# Patient Record
Sex: Male | Born: 1995 | Race: White | Hispanic: No | Marital: Single | Smoking: Current every day smoker
Health system: Southern US, Community
[De-identification: ages and names within clinical notes are randomized; demographics above are authoritative.]

## PROBLEM LIST (undated history)

## (undated) DIAGNOSIS — J45909 Unspecified asthma, uncomplicated: Secondary | ICD-10-CM

## (undated) DIAGNOSIS — F1120 Opioid dependence, uncomplicated: Secondary | ICD-10-CM

---
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
## 2016-05-23 ENCOUNTER — Encounter (HOSPITAL_COMMUNITY): Payer: Self-pay | Admitting: Emergency Medicine

## 2016-05-23 ENCOUNTER — Emergency Department (HOSPITAL_COMMUNITY)
Admission: EM | Admit: 2016-05-23 | Discharge: 2016-05-24 | Disposition: A | Discharge status: Other Acute Inpatient Hospital | Payer: BC Managed Care – PPO | Attending: Emergency Medicine | Admitting: Emergency Medicine

## 2016-05-23 DIAGNOSIS — F1920 Other psychoactive substance dependence, uncomplicated: Secondary | ICD-10-CM | POA: Diagnosis not present

## 2016-05-23 DIAGNOSIS — F332 Major depressive disorder, recurrent severe without psychotic features: Secondary | ICD-10-CM | POA: Diagnosis present

## 2016-05-23 DIAGNOSIS — Z5181 Encounter for therapeutic drug level monitoring: Secondary | ICD-10-CM | POA: Diagnosis not present

## 2016-05-23 DIAGNOSIS — R45851 Suicidal ideations: Secondary | ICD-10-CM | POA: Diagnosis present

## 2016-05-23 DIAGNOSIS — F1120 Opioid dependence, uncomplicated: Secondary | ICD-10-CM | POA: Diagnosis present

## 2016-05-23 DIAGNOSIS — J45909 Unspecified asthma, uncomplicated: Secondary | ICD-10-CM | POA: Insufficient documentation

## 2016-05-23 DIAGNOSIS — F17210 Nicotine dependence, cigarettes, uncomplicated: Secondary | ICD-10-CM | POA: Diagnosis not present

## 2016-05-23 DIAGNOSIS — F1110 Opioid abuse, uncomplicated: Secondary | ICD-10-CM | POA: Diagnosis not present

## 2016-05-23 HISTORY — DX: Unspecified asthma, uncomplicated: J45.909

## 2016-05-23 HISTORY — DX: Opioid dependence, uncomplicated: F11.20

## 2016-05-23 MED ORDER — ONDANSETRON HCL 4 MG PO TABS: 4.0000 mg | ORAL_TABLET | Freq: Three times a day (TID) | ORAL | Status: DC | PRN

## 2016-05-23 MED ORDER — NICOTINE 21 MG/24HR TD PT24
21.0000 mg | MEDICATED_PATCH | Freq: Every day | TRANSDERMAL | Status: DC
  Administered 2016-05-24 (×2): 21 mg via TRANSDERMAL
  Filled 2016-05-23 (×2): qty 1

## 2016-05-23 MED ORDER — ALUM & MAG HYDROXIDE-SIMETH 200-200-20 MG/5ML PO SUSP: 30.0000 mL | ORAL | Status: DC | PRN

## 2016-05-23 NOTE — ED Triage Notes (Signed)
Pt brought in by [employer] IVC from [facility]. Pt states that he told his parents if they didn't come get him he would kill himself. Pt denies SI/HI at this time.

## 2016-05-23 NOTE — ED Provider Notes (Signed)
[facility]-EMERGENCY DEPT Provider Note   CSN: [id number] Arrival date & time: 05/23/16  2153     History   Chief Complaint Chief Complaint  Patient presents with  . Suicidal    IVC    HPI Roberto Rivas is a 21 y.o. male.  21 year old male presents from [facility] after being placed under IVC due to suicidal ideations that he expresses to his parents. Patient was being treated for opiate addiction in particular heroin. He denies any prior history of suicide attempt. States that he said this because he wanted to leave the facility. He wasn't happy with the treatment at [facility]. Denies any history of psychiatric illness. Presents by police      Past Medical History:  Diagnosis Date  . Asthma   . Opiate addiction (HCC)     There are no active problems to display for this patient.   History reviewed. No pertinent surgical history.     Home Medications    Prior to Admission medications   Medication Sig Start Date End Date Taking? Authorizing Provider  ibuprofen (ADVIL,MOTRIN) 200 MG tablet Take 400 mg by mouth every 6 (six) hours as needed for fever, headache, mild pain or moderate pain.   Yes Historical Provider, MD    Family History History reviewed. No pertinent family history.  Social History Social History  Substance Use Topics  . Smoking status: Current Every Day Smoker    Packs/day: 1.00    Years: 2.00    Types: Cigarettes  . Smokeless tobacco: Never Used  . Alcohol use Yes     Comment: "fifth and a half a day"     Allergies   Banana   Review of Systems Review of Systems  All other systems reviewed and are negative.    Physical Exam Updated Vital Signs BP 120/58 (BP Location: Right Arm)   Pulse 74   Temp 97.6 F (36.4 C) (Oral)   Resp 18   Ht 6' (1.829 m)   Wt 63.5 kg   SpO2 98%   BMI 18.99 kg/m   Physical Exam  Constitutional: He is oriented to person, place, and time. He appears well-developed and well-nourished.   Non-toxic appearance. No distress.  HENT:  Head: Normocephalic and atraumatic.  Eyes: Conjunctivae, EOM and lids are normal. Pupils are equal, round, and reactive to light.  Neck: Normal range of motion. Neck supple. No tracheal deviation present. No thyroid mass present.  Cardiovascular: Normal rate, regular rhythm and normal heart sounds.  Exam reveals no gallop.   No murmur heard. Pulmonary/Chest: Effort normal and breath sounds normal. No stridor. No respiratory distress. He has no decreased breath sounds. He has no wheezes. He has no rhonchi. He has no rales.  Abdominal: Soft. Normal appearance and bowel sounds are normal. He exhibits no distension. There is no tenderness. There is no rebound and no CVA tenderness.  Musculoskeletal: Normal range of motion. He exhibits no edema or tenderness.  Neurological: He is alert and oriented to person, place, and time. He has normal strength. No cranial nerve deficit or sensory deficit. GCS eye subscore is 4. GCS verbal subscore is 5. GCS motor subscore is 6.  Skin: Skin is warm and dry. No abrasion and no rash noted.  Psychiatric: He has a normal mood and affect. His speech is normal and behavior is normal. He expresses no suicidal plans and no homicidal plans.  Nursing note and vitals reviewed.    ED Treatments / Results  Labs (all labs  ordered are listed, but only abnormal results are displayed) Labs Reviewed  COMPREHENSIVE METABOLIC PANEL  ETHANOL  SALICYLATE LEVEL  ACETAMINOPHEN LEVEL  CBC  RAPID URINE DRUG SCREEN, HOSP PERFORMED    EKG  EKG Interpretation None       Radiology No results found.  Procedures Procedures (including critical care time)  Medications Ordered in ED Medications - No data to display   Initial Impression / Assessment and Plan / ED Course  I have reviewed the triage vital signs and the nursing notes.  Pertinent labs & imaging results that were available during my care of the patient were reviewed  by me and considered in my medical decision making (see chart for details).     Patient currently denies suicidal or homicidal ideations. Patient will be medically cleared and then evaluated by TTS for disposition  Final Clinical Impressions(s) / ED Diagnoses   Final diagnoses:  None    New Prescriptions New Prescriptions   No medications on file     Lorre NickAnthony Zahi Plaskett, MD 05/23/16 2335

## 2016-05-23 NOTE — ED Notes (Signed)
TTS at bedside. 

## 2016-05-23 NOTE — ED Notes (Signed)
Bed: WHALA Expected date:  Expected time:  Means of arrival:  Comments: No bed. 

## 2016-05-23 NOTE — ED Notes (Signed)
Bed: WA28 Expected date:  Expected time:  Means of arrival:  Comments: IVC 

## 2016-05-24 ENCOUNTER — Inpatient Hospital Stay (HOSPITAL_COMMUNITY)
Admission: AD | Admit: 2016-05-24 | Discharge: 2016-05-29 | DRG: 881 | Disposition: A | Discharge status: Home / Self Care | Payer: BC Managed Care – PPO | Attending: Psychiatry | Admitting: Psychiatry

## 2016-05-24 ENCOUNTER — Encounter (HOSPITAL_COMMUNITY): Payer: Self-pay

## 2016-05-24 DIAGNOSIS — F419 Anxiety disorder, unspecified: Secondary | ICD-10-CM | POA: Diagnosis present

## 2016-05-24 DIAGNOSIS — Y900 Blood alcohol level of less than 20 mg/100 ml: Secondary | ICD-10-CM | POA: Diagnosis present

## 2016-05-24 DIAGNOSIS — Z818 Family history of other mental and behavioral disorders: Secondary | ICD-10-CM | POA: Diagnosis not present

## 2016-05-24 DIAGNOSIS — F1994 Other psychoactive substance use, unspecified with psychoactive substance-induced mood disorder: Secondary | ICD-10-CM | POA: Diagnosis present

## 2016-05-24 DIAGNOSIS — F1120 Opioid dependence, uncomplicated: Secondary | ICD-10-CM | POA: Diagnosis present

## 2016-05-24 DIAGNOSIS — F1020 Alcohol dependence, uncomplicated: Secondary | ICD-10-CM | POA: Diagnosis present

## 2016-05-24 DIAGNOSIS — F1123 Opioid dependence with withdrawal: Secondary | ICD-10-CM | POA: Diagnosis present

## 2016-05-24 DIAGNOSIS — Z79899 Other long term (current) drug therapy: Secondary | ICD-10-CM

## 2016-05-24 DIAGNOSIS — R0982 Postnasal drip: Secondary | ICD-10-CM | POA: Diagnosis present

## 2016-05-24 DIAGNOSIS — R45851 Suicidal ideations: Secondary | ICD-10-CM | POA: Diagnosis present

## 2016-05-24 DIAGNOSIS — F3289 Other specified depressive episodes: Secondary | ICD-10-CM

## 2016-05-24 DIAGNOSIS — J45909 Unspecified asthma, uncomplicated: Secondary | ICD-10-CM | POA: Diagnosis present

## 2016-05-24 DIAGNOSIS — F17210 Nicotine dependence, cigarettes, uncomplicated: Secondary | ICD-10-CM | POA: Diagnosis present

## 2016-05-24 DIAGNOSIS — F1920 Other psychoactive substance dependence, uncomplicated: Secondary | ICD-10-CM

## 2016-05-24 DIAGNOSIS — F329 Major depressive disorder, single episode, unspecified: Principal | ICD-10-CM | POA: Diagnosis present

## 2016-05-24 DIAGNOSIS — F332 Major depressive disorder, recurrent severe without psychotic features: Secondary | ICD-10-CM | POA: Diagnosis present

## 2016-05-24 DIAGNOSIS — J029 Acute pharyngitis, unspecified: Secondary | ICD-10-CM | POA: Diagnosis present

## 2016-05-24 DIAGNOSIS — Z91018 Allergy to other foods: Secondary | ICD-10-CM | POA: Diagnosis not present

## 2016-05-24 DIAGNOSIS — F1290 Cannabis use, unspecified, uncomplicated: Secondary | ICD-10-CM | POA: Diagnosis present

## 2016-05-24 DIAGNOSIS — Z811 Family history of alcohol abuse and dependence: Secondary | ICD-10-CM | POA: Diagnosis not present

## 2016-05-24 DIAGNOSIS — F19239 Other psychoactive substance dependence with withdrawal, unspecified: Secondary | ICD-10-CM | POA: Clinically undetermined

## 2016-05-24 DIAGNOSIS — F1924 Other psychoactive substance dependence with psychoactive substance-induced mood disorder: Secondary | ICD-10-CM

## 2016-05-24 LAB — COMPREHENSIVE METABOLIC PANEL
ALBUMIN: 4 g/dL (ref 3.5–5.0)
ALT: 15 U/L — ABNORMAL LOW (ref 17–63)
ANION GAP: 7 (ref 5–15)
AST: 23 U/L (ref 15–41)
Alkaline Phosphatase: 51 U/L (ref 38–126)
BILIRUBIN TOTAL: 0.6 mg/dL (ref 0.3–1.2)
BUN: 20 mg/dL (ref 6–20)
CHLORIDE: 106 mmol/L (ref 101–111)
CO2: 26 mmol/L (ref 22–32)
Calcium: 8.9 mg/dL (ref 8.9–10.3)
Creatinine, Ser: 0.98 mg/dL (ref 0.61–1.24)
GFR calc Af Amer: >60 mL/min (ref >60)
GFR calc non Af Amer: >60 mL/min (ref >60)
GLUCOSE: 82 mg/dL (ref 65–99)
POTASSIUM: 4 mmol/L (ref 3.5–5.1)
SODIUM: 139 mmol/L (ref 135–145)
TOTAL PROTEIN: 6.5 g/dL (ref 6.5–8.1)

## 2016-05-24 LAB — CBC
HEMATOCRIT: 41.7 % (ref 39.0–52.0)
Hemoglobin: 14.3 g/dL (ref 13.0–17.0)
MCH: 30.2 pg (ref 26.0–34.0)
MCHC: 34.3 g/dL (ref 30.0–36.0)
MCV: 88 fL (ref 78.0–100.0)
PLATELETS: 180 10*3/uL (ref 150–400)
RBC: 4.74 MIL/uL (ref 4.22–5.81)
RDW: 12.6 % (ref 11.5–15.5)
WBC: 6.3 10*3/uL (ref 4.0–10.5)

## 2016-05-24 LAB — ACETAMINOPHEN LEVEL

## 2016-05-24 LAB — ETHANOL: Alcohol, Ethyl (B): <5 mg/dL (ref <5)

## 2016-05-24 LAB — RAPID URINE DRUG SCREEN, HOSP PERFORMED
Amphetamines: NOT DETECTED
BARBITURATES: NOT DETECTED
BENZODIAZEPINES: POSITIVE — AB
COCAINE: NOT DETECTED
Opiates: NOT DETECTED
Tetrahydrocannabinol: POSITIVE — AB

## 2016-05-24 LAB — SALICYLATE LEVEL: Salicylate Lvl: <7 mg/dL (ref 2.8–30.0)

## 2016-05-24 MED ORDER — HYDROXYZINE HCL 25 MG PO TABS
25.0000 mg | ORAL_TABLET | Freq: Four times a day (QID) | ORAL | Status: DC | PRN
  Administered 2016-05-24 – 2016-05-25 (×3): 25 mg via ORAL
  Filled 2016-05-24 (×3): qty 1

## 2016-05-24 MED ORDER — AMOXICILLIN-POT CLAVULANATE 875-125 MG PO TABS
1.0000 | ORAL_TABLET | Freq: Two times a day (BID) | ORAL | Status: DC
  Administered 2016-05-24 – 2016-05-28 (×9): 1 via ORAL
  Filled 2016-05-24 (×12): qty 1

## 2016-05-24 MED ORDER — HYDROXYZINE HCL 25 MG PO TABS
25.0000 mg | ORAL_TABLET | Freq: Four times a day (QID) | ORAL | Status: DC | PRN
  Administered 2016-05-24: 25 mg via ORAL
  Filled 2016-05-24: qty 1

## 2016-05-24 MED ORDER — CLONIDINE HCL 0.1 MG PO TABS
0.1000 mg | ORAL_TABLET | Freq: Once | ORAL | Status: AC
  Administered 2016-05-24: 0.1 mg via ORAL
  Filled 2016-05-24: qty 1

## 2016-05-24 MED ORDER — CLONIDINE HCL 0.1 MG PO TABS
0.1000 mg | ORAL_TABLET | Freq: Four times a day (QID) | ORAL | Status: AC
  Administered 2016-05-24 – 2016-05-25 (×2): 0.1 mg via ORAL
  Filled 2016-05-24 (×10): qty 1

## 2016-05-24 MED ORDER — ACETAMINOPHEN 325 MG PO TABS
650.0000 mg | ORAL_TABLET | Freq: Four times a day (QID) | ORAL | Status: DC | PRN
  Administered 2016-05-24 – 2016-05-25 (×2): 650 mg via ORAL
  Filled 2016-05-24 (×2): qty 2

## 2016-05-24 MED ORDER — ONDANSETRON 4 MG PO TBDP: 4.0000 mg | ORAL_TABLET | Freq: Four times a day (QID) | ORAL | Status: DC | PRN

## 2016-05-24 MED ORDER — LOPERAMIDE HCL 2 MG PO CAPS: 2.0000 mg | ORAL_CAPSULE | ORAL | Status: DC | PRN

## 2016-05-24 MED ORDER — ENSURE ENLIVE PO LIQD
237.0000 mL | Freq: Two times a day (BID) | ORAL | Status: DC
  Administered 2016-05-25 – 2016-05-26 (×3): 237 mL via ORAL

## 2016-05-24 MED ORDER — ALBUTEROL SULFATE (2.5 MG/3ML) 0.083% IN NEBU
3.0000 mL | INHALATION_SOLUTION | Freq: Once | RESPIRATORY_TRACT | Status: AC
  Administered 2016-05-24: 3 mL via RESPIRATORY_TRACT

## 2016-05-24 MED ORDER — METHOCARBAMOL 500 MG PO TABS
500.0000 mg | ORAL_TABLET | Freq: Three times a day (TID) | ORAL | Status: DC | PRN
  Administered 2016-05-24: 500 mg via ORAL
  Filled 2016-05-24: qty 1

## 2016-05-24 MED ORDER — METHOCARBAMOL 500 MG PO TABS
500.0000 mg | ORAL_TABLET | Freq: Three times a day (TID) | ORAL | Status: DC | PRN
  Administered 2016-05-24 – 2016-05-26 (×4): 500 mg via ORAL
  Filled 2016-05-24 (×4): qty 1

## 2016-05-24 MED ORDER — GABAPENTIN 300 MG PO CAPS
300.0000 mg | ORAL_CAPSULE | Freq: Three times a day (TID) | ORAL | Status: DC
  Administered 2016-05-24 – 2016-05-26 (×6): 300 mg via ORAL
  Filled 2016-05-24 (×14): qty 1

## 2016-05-24 MED ORDER — CARBAMAZEPINE ER 200 MG PO TB12: 200.0000 mg | ORAL_TABLET | Freq: Two times a day (BID) | ORAL | Status: DC

## 2016-05-24 MED ORDER — CLONIDINE HCL 0.1 MG PO TABS: 0.1000 mg | ORAL_TABLET | ORAL | Status: DC

## 2016-05-24 MED ORDER — CLONIDINE HCL 0.1 MG PO TABS
0.1000 mg | ORAL_TABLET | ORAL | Status: AC
  Administered 2016-05-26 – 2016-05-28 (×4): 0.1 mg via ORAL
  Filled 2016-05-24 (×4): qty 1

## 2016-05-24 MED ORDER — CLONIDINE HCL 0.1 MG PO TABS
0.1000 mg | ORAL_TABLET | Freq: Four times a day (QID) | ORAL | Status: DC
  Administered 2016-05-24: 0.1 mg via ORAL
  Filled 2016-05-24: qty 1

## 2016-05-24 MED ORDER — CLONIDINE HCL 0.1 MG PO TABS
0.1000 mg | ORAL_TABLET | Freq: Every day | ORAL | Status: DC
Start: 2016-05-28 — End: 2016-05-24

## 2016-05-24 MED ORDER — GABAPENTIN 300 MG PO CAPS
300.0000 mg | ORAL_CAPSULE | Freq: Three times a day (TID) | ORAL | Status: DC
  Administered 2016-05-24: 300 mg via ORAL
  Filled 2016-05-24: qty 1

## 2016-05-24 MED ORDER — NICOTINE 21 MG/24HR TD PT24
21.0000 mg | MEDICATED_PATCH | Freq: Every day | TRANSDERMAL | Status: DC
  Administered 2016-05-25 – 2016-05-28 (×4): 21 mg via TRANSDERMAL
  Filled 2016-05-24 (×9): qty 1

## 2016-05-24 MED ORDER — CLONIDINE HCL 0.1 MG PO TABS
0.1000 mg | ORAL_TABLET | Freq: Every day | ORAL | Status: DC
  Filled 2016-05-24 (×2): qty 1

## 2016-05-24 MED ORDER — GUAIFENESIN ER 600 MG PO TB12
600.0000 mg | ORAL_TABLET | Freq: Two times a day (BID) | ORAL | Status: DC | PRN
  Administered 2016-05-24: 600 mg via ORAL
  Filled 2016-05-24: qty 1

## 2016-05-24 MED ORDER — MAGNESIUM HYDROXIDE 400 MG/5ML PO SUSP
30.0000 mL | Freq: Every day | ORAL | Status: DC | PRN
  Administered 2016-05-25: 30 mL via ORAL
  Filled 2016-05-24: qty 30

## 2016-05-24 MED ORDER — NAPROXEN 500 MG PO TABS
500.0000 mg | ORAL_TABLET | Freq: Two times a day (BID) | ORAL | Status: DC | PRN
  Administered 2016-05-24 (×2): 500 mg via ORAL
  Filled 2016-05-24 (×2): qty 1

## 2016-05-24 MED ORDER — ALBUTEROL SULFATE (2.5 MG/3ML) 0.083% IN NEBU: 3.0000 mL | INHALATION_SOLUTION | Freq: Four times a day (QID) | RESPIRATORY_TRACT | Status: DC | PRN

## 2016-05-24 MED ORDER — DICYCLOMINE HCL 20 MG PO TABS: 20.0000 mg | ORAL_TABLET | Freq: Four times a day (QID) | ORAL | Status: DC | PRN

## 2016-05-24 MED ORDER — ALBUTEROL SULFATE (2.5 MG/3ML) 0.083% IN NEBU
3.0000 mL | INHALATION_SOLUTION | Freq: Four times a day (QID) | RESPIRATORY_TRACT | Status: DC | PRN
  Filled 2016-05-24: qty 3

## 2016-05-24 MED ORDER — ONDANSETRON 4 MG PO TBDP
4.0000 mg | ORAL_TABLET | Freq: Four times a day (QID) | ORAL | Status: DC | PRN
  Administered 2016-05-28: 4 mg via ORAL
  Filled 2016-05-24 (×2): qty 1

## 2016-05-24 MED ORDER — ALUM & MAG HYDROXIDE-SIMETH 200-200-20 MG/5ML PO SUSP
30.0000 mL | ORAL | Status: DC | PRN
  Administered 2016-05-25: 30 mL via ORAL
  Filled 2016-05-24: qty 30

## 2016-05-24 MED ORDER — ONDANSETRON HCL 4 MG PO TABS
4.0000 mg | ORAL_TABLET | Freq: Three times a day (TID) | ORAL | Status: DC | PRN
Start: 2016-05-24 — End: 2016-05-29
  Administered 2016-05-25: 4 mg via ORAL

## 2016-05-24 MED ORDER — TRAZODONE HCL 100 MG PO TABS: 100.0000 mg | ORAL_TABLET | Freq: Every day | ORAL | Status: DC

## 2016-05-24 MED ORDER — TRAZODONE HCL 100 MG PO TABS
100.0000 mg | ORAL_TABLET | Freq: Every day | ORAL | Status: DC
  Administered 2016-05-25 – 2016-05-28 (×4): 100 mg via ORAL
  Filled 2016-05-24 (×8): qty 1

## 2016-05-24 MED ORDER — DICYCLOMINE HCL 20 MG PO TABS
20.0000 mg | ORAL_TABLET | Freq: Four times a day (QID) | ORAL | Status: DC | PRN
  Administered 2016-05-25 – 2016-05-26 (×3): 20 mg via ORAL
  Filled 2016-05-24 (×3): qty 1

## 2016-05-24 MED ORDER — NAPROXEN 500 MG PO TABS
500.0000 mg | ORAL_TABLET | Freq: Two times a day (BID) | ORAL | Status: DC | PRN
  Administered 2016-05-24 – 2016-05-28 (×6): 500 mg via ORAL
  Filled 2016-05-24 (×6): qty 1

## 2016-05-24 MED ORDER — MENTHOL 3 MG MT LOZG
1.0000 | LOZENGE | OROMUCOSAL | Status: DC | PRN
  Administered 2016-05-25 (×2): 3 mg via ORAL

## 2016-05-24 MED ORDER — MENTHOL 3 MG MT LOZG
1.0000 | LOZENGE | OROMUCOSAL | Status: DC | PRN
  Filled 2016-05-24: qty 9

## 2016-05-24 NOTE — ED Notes (Signed)
Up to the bathroom 

## 2016-05-24 NOTE — ED Notes (Signed)
Pt aware UA needed. Unable to provide at this time. 

## 2016-05-24 NOTE — ED Notes (Signed)
Pt. Transferred to [facility] from ED to room after screening for contraband. Report to include Situation, Background, Assessment and [facility]ations [facility]. Pt. Oriented to unit including Q15 minute rounds as well as the security cameras for their protection. Patient is alert and oriented, warm and dry in no acute distress. Patient denies SI, HI, and AVH. Pt. Encouraged to let me know if needs arise.

## 2016-05-24 NOTE — ED Notes (Signed)
PO fluids encouraged.

## 2016-05-24 NOTE — ED Notes (Signed)
Pt sleeping soundly, easily aroused.  Pt is aware that he is going to be admitted and will transfer shortly.  Pt's mom is aware of his pending transfer and [facility], pt is aware

## 2016-05-24 NOTE — ED Notes (Addendum)
Pt ambulatory w/o difficulty with [facility] [facility].  Belongings given to officers.

## 2016-05-24 NOTE — Progress Notes (Signed)
Patient endorses dry cough, nasal drainage, post nasal drip, and sore throat x 8 days. Denies fever, SOB, N/V/D, and earache. Noted pharnygeal erythema, Tonsils grade 1, no tonsillar or pharyngeal exudates, maxillary sinus tenderness, and cervical node tenderness. No fever noted on review of vitals. Will treat for acute sinusitis.

## 2016-05-24 NOTE — Progress Notes (Signed)
Patient is a 20yo male,ad[facility], where [facility].  Per report, patient wanted to leave [facility] facility and asked his parents to pick him up. Parents refused. Patient then stated that he would hurt himself if they didn't. [facility] IVC'd him secondary to danger to self.   Patient states, "I was not going to do it. I just wanted my parents to pick me up."  Patient very tearful throughout the interview and at one point had a "panick attack". Patient was able to be supported through this episode and able to calm down. Patient reports decrease in weight due to no appetite, "If I don't smoke pot I don't have an appetite." Reports crushing and "snorting", roxycodone, opana and subutex. Patient reports historically taking xanax. Currently drinks 1/5th of liquor a day. No substances have been used since patient has been in [facility]-3-4 days.  Currently denies SI/HI/AVH. No skin issues. Patient reporting pain to bilateral wrist, "From the hand cuffs", and generalized pain.   Paperwork completed. Patient had no personal belongings with him. Oriented to unit. Q 15 minute checks initiated

## 2016-05-24 NOTE — ED Notes (Signed)
Up on the phone 

## 2016-05-24 NOTE — ED Notes (Signed)
Pt Father: (c) [phone]
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
## 2016-05-24 NOTE — ED Notes (Addendum)
Pt w/ bilat. Insp/exp wheezing, resp even and unlabored, no distress noted.  Pt reports hx of asthma and that he has used a unknown inhaler. Catha NottinghamJamison DNP updated

## 2016-05-24 NOTE — ED Notes (Addendum)
Pt's mother updated and  will check back later concerning disposition and is requesting to speak with someone about it prior to decision being made, will inform dnp.

## 2016-05-24 NOTE — ED Notes (Signed)
resp in for HHN 

## 2016-05-24 NOTE — ED Notes (Signed)
Sleeping, easily aroused.  Attempted to discuss admission with pt.  Pt became angry about being admitted talking loudly, stating that he will not participate in any treatment there.

## 2016-05-24 NOTE — ED Notes (Signed)
Roberto NottinghamJamison DNP updated concerning continued sore throat, orders as recorded
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
## 2016-05-24 NOTE — ED Notes (Signed)
Pt belongings wanded and placed in locker. Pt valuables: Rolex watch, wallet and cash, Jueell pen locked up with security. Key placed in pt locker.

## 2016-05-24 NOTE — ED Notes (Signed)
[facility] unable to take report at this time, call back in 45 mins

## 2016-05-24 NOTE — ED Notes (Signed)
Hourly rounding reveals patient sleeping in room. No complaints, stable, in no acute distress. Q15 minute rounds and monitoring via Security Cameras to continue. 

## 2016-05-25 ENCOUNTER — Encounter (HOSPITAL_COMMUNITY): Payer: Self-pay | Admitting: Psychiatry

## 2016-05-25 DIAGNOSIS — Z91018 Allergy to other foods: Secondary | ICD-10-CM

## 2016-05-25 DIAGNOSIS — F1924 Other psychoactive substance dependence with psychoactive substance-induced mood disorder: Secondary | ICD-10-CM

## 2016-05-25 DIAGNOSIS — Z818 Family history of other mental and behavioral disorders: Secondary | ICD-10-CM

## 2016-05-25 DIAGNOSIS — F3289 Other specified depressive episodes: Secondary | ICD-10-CM

## 2016-05-25 DIAGNOSIS — F1120 Opioid dependence, uncomplicated: Secondary | ICD-10-CM | POA: Diagnosis present

## 2016-05-25 DIAGNOSIS — F19239 Other psychoactive substance dependence with withdrawal, unspecified: Secondary | ICD-10-CM | POA: Clinically undetermined

## 2016-05-25 DIAGNOSIS — F1020 Alcohol dependence, uncomplicated: Secondary | ICD-10-CM | POA: Diagnosis present

## 2016-05-25 MED ORDER — LORAZEPAM 1 MG PO TABS
1.0000 mg | ORAL_TABLET | Freq: Four times a day (QID) | ORAL | Status: DC | PRN
  Administered 2016-05-25 – 2016-05-26 (×2): 1 mg via ORAL
  Filled 2016-05-25 (×2): qty 1
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
## 2016-05-25 NOTE — [facility] Suicide Risk Assessment (Signed)
[facility] INPATIENT:  Family/Significant Other Suicide Prevention Education  Suicide Prevention Education:  Patient Refusal for Family/Significant Other Suicide Prevention Education: The patient Francesca JewettJacob Steeber has refused to provide written consent for family/significant other to be provided Family/Significant Other Suicide Prevention Education during admission and/or prior to discharge.  Physician notified.  Beverly Sessionsywan J Maison Agrusa 05/25/2016, 11:18 AM

## 2016-05-25 NOTE — Progress Notes (Signed)
Patient attended AA group meeting.  

## 2016-05-25 NOTE — [facility] Group Notes (Signed)
[facility] LCSW Group Therapy Note   05/25/2016 at 10 AM   Type of Therapy and Topic: Group Therapy: Feelings Around Returning Home & Establishing a Supportive Framework and Activity to Identify signs of Improvement or Decompensation   Participation Level: Active   Description of Group:  Patients first processed thoughts and feelings about up coming discharge. These included fears of upcoming changes, lack of change, new living environments, judgements and expectations from others and overall stigma of MH issues. We then discussed what is a supportive framework? What does it look like feel like and how do I discern it from and unhealthy non-supportive network? Learn how to cope when supports are not helpful and don't support you. Discuss what to do when your family/friends are not supportive.   Therapeutic Goals Addressed in Processing Group:  1. Patient will identify one healthy supportive network that they can use at discharge. 2. Patient will identify one factor of a supportive framework and how to tell it from an unhealthy network. 3. Patient able to identify one coping skill to use when they do not have positive supports from others. 4. Patient will demonstrate ability to communicate their needs through discussion and/or role plays.  Summary of Patient Progress:  Pt engaged easily during group session and shared at length. As patients processed their anxiety about discharge and described healthy supports patient reported need for new supports as he has been living a life he is unhappy with. Patient processed some of his feeling related to getting out of his comfort zone in order to make progress. Patient chose a visual to represent decompensation as drunk/depressed/homeless and improvement as serenity and taking the time to ponder one's priorities.  Carney Bernatherine C Keona Bilyeu, LCSW

## 2016-05-25 NOTE — Progress Notes (Signed)
Data. Patient denies SI/HI/AVH. Patient interacting well with staff and other patients. Patient C/O detox symptoms and has received medications as ordered. Patient came to nurse and stated, "I am not suicidal. I really want everyone to know that I am not going to hurt myself. I am not like these people and I think that being here is going to give me PTSD." Isolated in his room all day. Refused meals and to take fluids, "My throat is really sore. I just can't swallow." Fluids encouraged and throat lozenges given for the soreness. Consequences of not eating and drinking were explained to patient. On his self assessment he reports 0/10 for depression and hopelessness and 6/10 for anxiety. Action. Emotional support and encouragement offered. Education provided on medication, indications and side effect. Q 15 minute checks done for safety. Response. Safety on the unit maintained through 15 minute checks.  Medications taken as prescribed. Remained calm and appropriate through out shift.

## 2016-05-25 NOTE — [facility] Suicide Risk Assessment (Signed)
[facility]   Nursing information obtained from:    Demographic factors:    Current Mental Status:    Loss Factors:    Historical Factors:    Risk Reduction Factors:     Total Time spent with patient: 30 minutes Principal Problem: Substance or medication-induced depressive disorder with onset during withdrawal [facility](HCC) Diagnosis:   Patient Active Problem List   Diagnosis Date Noted  . Opioid use disorder, moderate, dependence (HCC) [F11.20] 05/25/2016  . Alcohol use disorder, severe, dependence (HCC) [F10.20] 05/25/2016  . Substance or medication-induced depressive disorder with onset during withdrawal [facility](HCC) [F19.94] 05/25/2016   Subjective Data: Patient states " I am ok , I want to do well and get out of here. I was abusing opioids and alcohol. I drink a lot on a daily basis."   Continued Clinical Symptoms:    The "Alcohol Use Disorders Identification Test", Guidelines for Use in Primary Care, Second Edition.  World Science writerHealth Organization [facility](WHO). Score between 0-7:  no or low risk or alcohol related problems. Score between 8-15:  moderate risk of alcohol related problems. Score between 16-19:  high risk of alcohol related problems. Score 20 or above:  warrants further diagnostic evaluation for alcohol dependence and treatment.   CLINICAL FACTORS:   Alcohol/Substance Abuse/Dependencies   Musculoskeletal: Strength & Muscle Tone: within normal limits Gait & Station: normal Patient leans: Front  Psychiatric Specialty Exam: Physical Exam  Review of Systems  Psychiatric/Behavioral: Positive for substance abuse. The patient is nervous/anxious.   All other systems reviewed and are negative.   Blood pressure (!) 99/57, pulse 68, temperature 98.5 F (36.9 C), temperature source Oral, resp. rate (!) 22, height 6' (1.829 m), weight 61.2 kg (135 lb), SpO2 100 %.Body mass index is 18.31 kg/m.  General Appearance: Casual  Eye Contact:  Minimal  Speech:  Slow   Volume:  Decreased  Mood:  Anxious and Depressed  Affect:  Depressed  Thought Process:  Goal Directed and Descriptions of Associations: Intact  Orientation:  Full (Time, Place, and Person)  Thought Content:  Rumination  Suicidal Thoughts:  contracts for safety on the unit - presented with SI   Homicidal Thoughts:  No  Memory:  Immediate;   Fair Recent;   Fair Remote;   Fair  Judgement:  Impaired  Insight:  Shallow  Psychomotor Activity:  Restlessness  Concentration:  Concentration: Poor and Attention Span: Poor  Recall:  [employer]  Fund of Knowledge:  Fair  Language:  Fair  Akathisia:  No  Handed:  Right  AIMS (if indicated):     Assets:  Desire for Improvement  ADL's:  Intact  Cognition:  WNL  Sleep:  Number of Hours: 6.25      COGNITIVE FEATURES THAT CONTRIBUTE TO RISK:  Closed-mindedness, Polarized thinking and Thought constriction (tunnel vision)    SUICIDE RISK:   Moderate:  Frequent suicidal ideation with limited intensity, and duration, some specificity in terms of plans, no associated intent, good self-control, limited dysphoria/symptomatology, some risk factors present, and identifiable protective factors, including available and accessible social support.  PLAN OF CARE: Case discussed with NP. Could start an antidepressant once patient agrees. Continue COWS protocol.   I certify that inpatient services furnished can reasonably be expected to improve the patient's condition.   Rhys Lichty, MD 05/25/2016, 12:53 PM

## 2016-05-25 NOTE — [facility] Group Notes (Signed)
[facility] Group Notes:  (Nursing/MHT/Case Management/Adjunct)  Date:  05/25/2016  Time:  2:34 PM  Type of Therapy:  Nurse Education  Participation Level:  Did Not Attend  Roberto Kizziah CowerBarbara M Lexus Rivas 05/25/2016, 2:34 PM

## 2016-05-25 NOTE — Progress Notes (Addendum)
Patient's father, Sondra BargesRichard Dimartino, [phone], wanted to be contacted in order to share additional information about patient's history. Patient's father shared that [facility] withdrawal from opiates, alcohol and marijuana. Father identified that patient has had a long history of substance abuse and that patient has a history of ADHD diagnosis. Father added that patient refused to take medication in order to deal with ADHD, but patient did not comply with medications and self medicated with marijuana. Patient's father identified that there were some challenges that patient's father and mother had while communicating with [facility] and he felt that the staff their escalated that patient, leading to transition to the ED. Father denies any history of mental illness other than ADHD diagnosis and treatment.  Patient has completed Consent to Release information for team to discuss patient progress and discharge plan with parents. Mother - [phone] and father - [phone]. CSW will continue to support patient toward discharge.  Beverly Sessionsywan J Patrece Tallie MSW, LCSW

## 2016-05-26 DIAGNOSIS — F1020 Alcohol dependence, uncomplicated: Secondary | ICD-10-CM

## 2016-05-26 DIAGNOSIS — F1994 Other psychoactive substance use, unspecified with psychoactive substance-induced mood disorder: Secondary | ICD-10-CM

## 2016-05-26 DIAGNOSIS — F1120 Opioid dependence, uncomplicated: Secondary | ICD-10-CM

## 2016-05-26 DIAGNOSIS — Z811 Family history of alcohol abuse and dependence: Secondary | ICD-10-CM

## 2016-05-26 DIAGNOSIS — F17210 Nicotine dependence, cigarettes, uncomplicated: Secondary | ICD-10-CM

## 2016-05-26 DIAGNOSIS — Z79899 Other long term (current) drug therapy: Secondary | ICD-10-CM

## 2016-05-26 LAB — TSH: TSH: 1.279 u[IU]/mL (ref 0.350–4.500)

## 2016-05-26 MED ORDER — GABAPENTIN 400 MG PO CAPS
400.0000 mg | ORAL_CAPSULE | Freq: Three times a day (TID) | ORAL | Status: DC
  Administered 2016-05-26 – 2016-05-28 (×7): 400 mg via ORAL
  Filled 2016-05-26 (×14): qty 1

## 2016-05-26 MED ORDER — METHOCARBAMOL 750 MG PO TABS
750.0000 mg | ORAL_TABLET | Freq: Three times a day (TID) | ORAL | Status: DC | PRN
  Administered 2016-05-26 – 2016-05-28 (×5): 750 mg via ORAL
  Filled 2016-05-26 (×5): qty 1

## 2016-05-26 MED ORDER — ENSURE ENLIVE PO LIQD
237.0000 mL | Freq: Three times a day (TID) | ORAL | Status: DC
  Administered 2016-05-26 – 2016-05-28 (×6): 237 mL via ORAL

## 2016-05-26 MED ORDER — HYDROXYZINE HCL 50 MG PO TABS
50.0000 mg | ORAL_TABLET | Freq: Four times a day (QID) | ORAL | Status: DC | PRN
  Administered 2016-05-26 – 2016-05-28 (×5): 50 mg via ORAL
  Filled 2016-05-26 (×6): qty 1

## 2016-05-26 NOTE — Plan of Care (Signed)
Problem: Education: Goal: Utilization of techniques to improve thought processes will improve Outcome: Progressing Nurse discussed depression/coping skills with patient.    

## 2016-05-26 NOTE — Progress Notes (Signed)
Did not attend group 
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
## 2016-05-26 NOTE — Progress Notes (Addendum)
Patient wants to be talk to MD first Tuesday morning asap because he wants to be discharged.  Feels that he needs to go to another treatment facility that he has discussed with SW.  Patient talked to nurse about the benefits [facility] for [facility] use.  Patient became upset with nurse after trying to explain the medical benefits of THC and this nurse was not convinced.
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
## 2016-05-26 NOTE — [facility] Suicide Risk Assessment (Signed)
[facility] INPATIENT:  Family/Significant Other Suicide Prevention Education  Suicide Prevention Education:  Education Completed; Roberto BargesRichard Rivas, father [phone],  (name of family member/significant other) has been identified by the patient as the family member/significant other with whom the patient will be residing, and identified as the person(s) who will aid the patient in the event of a mental health crisis (suicidal ideations/suicide attempt).  With written consent from the patient, the family member/significant other has been provided the following suicide prevention education, prior to the and/or following the discharge of the patient.  The suicide prevention education provided includes the following:  Suicide risk factors  Suicide prevention and interventions  National Suicide Hotline telephone number  [facility] assessment telephone number  [facility] Emergency Assistance 911  [facility] and/or Residential Mobile Crisis Unit telephone number  Request made of family/significant other to:  Remove weapons (e.g., guns, rifles, knives), all items previously/currently identified as safety concern.    Remove drugs/medications (over-the-counter, prescriptions, illicit drugs), all items previously/currently identified as a safety concern.  The family member/significant other verbalizes understanding of the suicide prevention education information provided.  The family member/significant other agrees to remove the items of safety concern listed above.   Father called to provide information, family lives in [place], patient lived in [place].  Patient checked himself in voluntarily at [facility].  Only stayed one night at treatment facility, wanted to come home and parents were unable to "come get him."  Per father PA at [facility] told patient parents were "not coming to get him" vs father's statement that he lived 3 hours away and needed to make  plans.  Father feels medical provider had "agitated" patient such that situation escalated/intensified.  Placement at [facility] for substance abuse.  Has admitted to parents is "is on marijuana real heavy", Strong family history of addiction in grandparents;parents do not use substances for "religious and social reasons."  Feels patient will "manipulate" to get pain medication.    Parents will "be there any time we can"; states that patient has been calling parents stating "you got to get me out of here."  Has lived in [place], [place], [place], [place], [place] - "hopped from place to place."  Has lived w friends in various locations.  States that he has "traumatic experiences" in [facility] and in [facility].  Parents have withdrawn financial support from patient in effort to encourage patient. Parents encouraged to contact substance abuse interventionist on outpatient basis as well as access 12 step family support programs.    Reviewed SPE w parents, father denies any past history of suicide attempts or self injurious behavior.  Has history of ADD which has not responded to medications  No access to weapons or unsecured medications at parents house; father unsure about patient's access to lethal means when he lives w friends.        Roberto Rivas 05/26/2016, 11:04 AM

## 2016-05-26 NOTE — Tx Team (Signed)
Interdisciplinary Treatment and Diagnostic Plan Update  05/26/2016 Time of Session: 9:30 AM Roberto Rivas MRN: [id number]  Principal Diagnosis: Substance or medication-induced depressive disorder with onset during withdrawal [facility])  Secondary Diagnoses: Principal Problem:   Substance or medication-induced depressive disorder with onset during withdrawal [facility]) Active Problems:   Opioid use disorder, moderate, dependence (HCC)   Alcohol use disorder, severe, dependence ([place])   Current Medications:  Current Facility-Administered Medications  Medication Dose Route Frequency Provider Last Rate Last Dose  . acetaminophen (TYLENOL) tablet 650 mg  650 mg Oral Q6H PRN Patrecia Pour, NP   650 mg at 05/25/16 1224  . albuterol (PROVENTIL) (2.5 MG/3ML) 0.083% nebulizer solution 3 mL  3 mL Inhalation Q6H PRN Patrecia Pour, NP      . alum & mag hydroxide-simeth (MAALOX/MYLANTA) 200-200-20 MG/5ML suspension 30 mL  30 mL Oral PRN Patrecia Pour, NP   30 mL at 05/25/16 1814  . amoxicillin-clavulanate (AUGMENTIN) 875-125 MG per tablet 1 tablet  1 tablet Oral Q12H Rozetta Nunnery, NP   1 tablet at 05/26/16 0826  . cloNIDine (CATAPRES) tablet 0.1 mg  0.1 mg Oral QID Patrecia Pour, NP   0.1 mg at 05/25/16 1742   Followed by  . cloNIDine (CATAPRES) tablet 0.1 mg  0.1 mg Oral BH-qamhs Patrecia Pour, NP       Followed by  . [START ON 05/29/2016] cloNIDine (CATAPRES) tablet 0.1 mg  0.1 mg Oral QAC breakfast Patrecia Pour, NP      . dicyclomine (BENTYL) tablet 20 mg  20 mg Oral Q6H PRN Patrecia Pour, NP   20 mg at 05/25/16 1224  . feeding supplement (ENSURE ENLIVE) (ENSURE ENLIVE) liquid 237 mL  237 mL Oral TID BM Fernando A Cobos, MD      . gabapentin (NEURONTIN) capsule 300 mg  300 mg Oral TID Patrecia Pour, NP   300 mg at 05/26/16 0827  . guaiFENesin (MUCINEX) 12 hr tablet 600 mg  600 mg Oral BID PRN Rozetta Nunnery, NP   600 mg at 05/24/16 2159  . hydrOXYzine (ATARAX/VISTARIL) tablet 25 mg  25 mg Oral Q6H  PRN Patrecia Pour, NP   25 mg at 05/25/16 1702  . loperamide (IMODIUM) capsule 2-4 mg  2-4 mg Oral PRN Patrecia Pour, NP      . LORazepam (ATIVAN) tablet 1 mg  1 mg Oral Q6H PRN Derrill Center, NP   1 mg at 05/25/16 1727  . magnesium hydroxide (MILK OF MAGNESIA) suspension 30 mL  30 mL Oral Daily PRN Patrecia Pour, NP   30 mL at 05/25/16 0924  . menthol-cetylpyridinium (CEPACOL) lozenge 3 mg  1 lozenge Oral PRN Rozetta Nunnery, NP   3 mg at 05/25/16 1718  . methocarbamol (ROBAXIN) tablet 500 mg  500 mg Oral Q8H PRN Patrecia Pour, NP   500 mg at 05/25/16 2153  . naproxen (NAPROSYN) tablet 500 mg  500 mg Oral BID PRN Patrecia Pour, NP   500 mg at 05/25/16 0853  . nicotine (NICODERM CQ - dosed in mg/24 hours) patch 21 mg  21 mg Transdermal Daily Patrecia Pour, NP   21 mg at 05/26/16 0827  . ondansetron (ZOFRAN) tablet 4 mg  4 mg Oral Q8H PRN Patrecia Pour, NP   4 mg at 05/25/16 1726  . ondansetron (ZOFRAN-ODT) disintegrating tablet 4 mg  4 mg Oral Q6H PRN Patrecia Pour, NP      .  traZODone (DESYREL) tablet 100 mg  100 mg Oral QHS Patrecia Pour, NP   100 mg at 05/25/16 2150   PTA Medications: Prescriptions Prior to Admission  Medication Sig Dispense Refill Last Dose  . amphetamine-dextroamphetamine (ADDERALL XR) 25 MG 24 hr capsule Take by mouth.     Marland Kitchen amphetamine-dextroamphetamine (ADDERALL) 20 MG tablet Take by mouth.     Marland Kitchen ibuprofen (ADVIL,MOTRIN) 200 MG tablet Take 400 mg by mouth every 6 (six) hours as needed for fever, headache, mild pain or moderate pain.   05/22/2016 at Unknown time    Patient Stressors:    Patient Strengths:    Treatment Modalities: Medication Management, Group therapy, Case management,  1 to 1 session with clinician, Psychoeducation, Recreational therapy.   Physician Treatment Plan for Primary Diagnosis: Substance or medication-induced depressive disorder with onset during withdrawal ([place]) Long Term Goal(s): Improvement in symptoms so as ready for  discharge Improvement in symptoms so as ready for discharge   Short Term Goals: Ability to identify changes in lifestyle to reduce recurrence of condition will improve Ability to identify and develop effective coping behaviors will improve Ability to identify triggers associated with substance abuse/mental health issues will improve Ability to identify changes in lifestyle to reduce recurrence of condition will improve Ability to verbalize feelings will improve Ability to identify and develop effective coping behaviors will improve Compliance with prescribed medications will improve  Medication Management: Evaluate patient's response, side effects, and tolerance of medication regimen.  Therapeutic Interventions: 1 to 1 sessions, Unit Group sessions and Medication administration.  Evaluation of Outcomes: Not Met  Physician Treatment Plan for Secondary Diagnosis: Principal Problem:   Substance or medication-induced depressive disorder with onset during withdrawal ([place]) Active Problems:   Opioid use disorder, moderate, dependence (HCC)   Alcohol use disorder, severe, dependence ([place])  Long Term Goal(s): Improvement in symptoms so as ready for discharge Improvement in symptoms so as ready for discharge   Short Term Goals: Ability to identify changes in lifestyle to reduce recurrence of condition will improve Ability to identify and develop effective coping behaviors will improve Ability to identify triggers associated with substance abuse/mental health issues will improve Ability to identify changes in lifestyle to reduce recurrence of condition will improve Ability to verbalize feelings will improve Ability to identify and develop effective coping behaviors will improve Compliance with prescribed medications will improve     Medication Management: Evaluate patient's response, side effects, and tolerance of medication regimen.  Therapeutic Interventions: 1 to 1 sessions, Unit Group  sessions and Medication administration.  Evaluation of Outcomes: Not Met   RN Treatment Plan for Primary Diagnosis: Substance or medication-induced depressive disorder with onset during withdrawal ([place]) Long Term Goal(s): Knowledge of disease and therapeutic regimen to maintain health will improve  Short Term Goals: Ability to verbalize frustration and anger appropriately will improve, Ability to demonstrate self-control, Ability to participate in decision making will improve, Ability to verbalize feelings will improve, Ability to identify and develop effective coping behaviors will improve and Compliance with prescribed medications will improve  Medication Management: RN will administer medications as ordered by provider, will assess and evaluate patient's response and provide education to patient for prescribed medication. RN will report any adverse and/or side effects to prescribing provider.  Therapeutic Interventions: 1 on 1 counseling sessions, Psychoeducation, Medication administration, Evaluate responses to treatment, Monitor vital signs and CBGs as ordered, Perform/monitor CIWA, COWS, AIMS and Fall Risk screenings as ordered, Perform wound care treatments as ordered.  Evaluation of  Outcomes: Not Met   LCSW Treatment Plan for Primary Diagnosis: Substance or medication-induced depressive disorder with onset during withdrawal [facility]) Long Term Goal(s): Safe transition to appropriate next level of care at discharge, Engage patient in therapeutic group addressing interpersonal concerns.  Short Term Goals: Engage patient in aftercare planning with referrals and resources, Increase ability to appropriately verbalize feelings, Facilitate acceptance of mental health diagnosis and concerns, Facilitate patient progression through stages of change regarding substance use diagnoses and concerns, Identify triggers associated with mental health/substance abuse issues and Increase skills for wellness and  recovery  Therapeutic Interventions: Assess for all discharge needs, 1 to 1 time with Social worker, Explore available resources and support systems, Assess for adequacy in community support network, Educate family and significant other(s) on suicide prevention, Complete Psychosocial Assessment, Interpersonal group therapy.  Evaluation of Outcomes: Not Met   Progress in Treatment: Attending groups: No. Participating in groups: No. Taking medication as prescribed: Yes. Toleration medication: Yes. Family/Significant other contact made: No, will contact:  family if patient provides consent which he has refused to do at this point Patient understands diagnosis: No. Discussing patient identified problems/goals with staff: Yes. Medical problems stabilized or resolved: Yes. Denies suicidal/homicidal ideation: No. and As evidenced by:  admitted after suicidal ideation at substance abuse treatment facility, coping skills inadequate for community setting, contracts for safety on unit Issues/concerns per patient self-inventory: No. Other: na  New problem(s) identified: Yes, Describe:  family does not want contact w family, cannot articulate realistic plan for aftercare  New Short Term/Long Term Goal(s):  Engage patient in motivational interviewing re substance abuse issues and discharge planning, increase options  Discharge Plan or Barriers: no family support, wants to go to [place]  Reason for Continuation of Hospitalization: Medication stabilization Suicidal ideation  Estimated Length of Stay: 3 - 5 days  Attendees: Patient: 05/26/2016 10:53 AM  Physician: Gabriel Earing MD 05/26/2016 10:53 AM  Nursing: Nevada Crane RN 05/26/2016 10:53 AM  RN Care Manager: Addison Naegeli RN CM 05/26/2016 10:53 AM  Social Worker: Eusebio Me LCSW 05/26/2016 10:53 AM  Recreational Therapist:  05/26/2016 10:53 AM  Other:  05/26/2016 10:53 AM  Other:  05/26/2016 10:53 AM  Other: 05/26/2016 10:53 AM    Scribe for  Treatment Team: Beverely Pace, LCSW 05/26/2016 10:53 AM

## 2016-05-26 NOTE — Progress Notes (Signed)
Writer spoke with patient and he reports that he will be discharging on tomorrow. Writer explained the process for patients that are involuntary and patient reports that he is not supposed to be here in the first place. Writer encouraged him to discuss this matter with his doctor on tomorrow because he was starting to get upset. He was not able to take his scheduled clonidine d/t his bp being low. His other scheduled medications were givne. Safety maintained on unit with 15 min checks.
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
## 2016-05-26 NOTE — Progress Notes (Signed)
NUTRITION ASSESSMENT  Pt identified as at risk on the Malnutrition Screen Tool  INTERVENTION: 1. Educated patient on the importance of nutrition and encouraged intake of food and beverages. 2. Discussed weight goals. 3. Supplements: continue Ensure Enlive but will increase to TID, each supplement provides 350 kcal and 20 grams of protein   NUTRITION DIAGNOSIS: Unintentional weight loss related to sub-optimal intake as evidenced by pt report.   Goal: Pt to meet >/= 90% of their estimated nutrition needs.  Monitor:  PO intake  Assessment:  Pt admitted for SI. Pt was at [facility] for treatment/detox from heroin, opiates. No recent weight hx available. Pt is underweight. Pt currently has Ensure Enlive ordered BID; will increase to TID.   21 y.o. male  Height: Ht Readings from Last 1 Encounters:  05/24/16 6' (1.829 m)    Weight: Wt Readings from Last 1 Encounters:  05/24/16 135 lb (61.2 kg)    Weight Hx: Wt Readings from Last 10 Encounters:  05/24/16 135 lb (61.2 kg)  05/23/16 140 lb (63.5 kg)    BMI:  Body mass index is 18.31 kg/m. Pt meets criteria for underweight based on current BMI.  Estimated Nutritional Needs: Kcal: 25-30 kcal/kg Protein: > 1 gram protein/kg Fluid: 1 ml/kcal  Diet Order: Diet Heart Room service appropriate? Yes; Fluid consistency: Thin Pt is also offered choice of unit snacks mid-morning and mid-afternoon.  Pt is eating as desired.   Lab results and medications reviewed.     Trenton GammonJessica Cambre Matson, MS, RD, LDN, [facility] Inpatient Clinical Dietitian Pager # [phone] After hours/weekend pager # [phone]

## 2016-05-26 NOTE — Progress Notes (Signed)
Recreation Therapy Notes  Date: 05/26/16 Time: 0930 Location: [address] Hall Group Room  Group Topic: Stress Management  Goal Area(s) Addresses:  Patient will verbalize importance of using healthy stress management.  Patient will identify positive emotions associated with healthy stress management.   Intervention: Stress Management  Activity :  Daily Calm.  LRT introduced the stress management technique of meditation to the group.  LRT played a meditation from the Calm App to allow patients the opportunity to engage in the meditation.  Patients were to follow along with the meditation to fully engage in the technique.  Education:  Stress Management, Discharge Planning.   Education Outcome: Acknowledges edcuation/In group clarification offered/Needs additional education  Clinical Observations/Feedback: Pt did not attend group.    Caroll RancherMarjette Yailyn Strack, LRT/CTRS         Caroll RancherLindsay, Mollye Guinta A 05/26/2016 12:34 PM
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
## 2016-05-26 NOTE — Progress Notes (Signed)
[facility] MD Progress Note  05/26/2016 12:25 PM Roberto Rivas  MRN:  [id number] Subjective:  "I was feeling ok and now the meds are wearing off, I am having stomach cramps."   Objective:  Roberto Rivas, is here under IVC, he was at [employer], 3 days into detox and demanded to have his parents pick him, when they did not, when threatened to commit suicide.  He interacts well with others, describes his life in grandiose fashion, about having a quarter of a million dollars and bit coin investing.  He is compliant with meds and no adverse side effects.    Principal Problem: Substance or medication-induced depressive disorder with onset during withdrawal [facility]) Diagnosis:   Patient Active Problem List   Diagnosis Date Noted  . Opioid use disorder, moderate, dependence (HCC) [F11.20] 05/25/2016  . Alcohol use disorder, severe, dependence (HCC) [F10.20] 05/25/2016  . Substance or medication-induced depressive disorder with onset during withdrawal [facility]) [F19.94] 05/25/2016   Total Time spent with patient: 30 minutes  Past Psychiatric History: see HPI  Past Medical History:  Past Medical History:  Diagnosis Date  . Asthma   . Opiate addiction (HCC)    History reviewed. No pertinent surgical history. Family History:  Family History  Problem Relation Age of Onset  . Alcoholism Cousin   . Alcoholism Paternal Grandfather    Family Psychiatric  History: see HPI Social History:  History  Alcohol Use  . Yes    Comment: "fifth and a half a day"     History  Drug Use  . Types: Marijuana, Cocaine, IV    Social History   Social History  . Marital status: Single    Spouse name: N/A  . Number of children: N/A  . Years of education: N/A   Social History Main Topics  . Smoking status: Current Every Day Smoker    Packs/day: 1.00    Years: 2.00    Types: Cigarettes  . Smokeless tobacco: Never Used  . Alcohol use Yes     Comment: "fifth and a half a day"  . Drug use: Yes    Types: Marijuana,  Cocaine, IV  . Sexual activity: Not Asked   Other Topics Concern  . None   Social History Narrative  . None   Additional Social History:     Sleep: Fair  Appetite:  Fair  Current Medications: Current Facility-Administered Medications  Medication Dose Route Frequency Provider Last Rate Last Dose  . acetaminophen (TYLENOL) tablet 650 mg  650 mg Oral Q6H PRN Charm Rings, NP   650 mg at 05/25/16 1224  . albuterol (PROVENTIL) (2.5 MG/3ML) 0.083% nebulizer solution 3 mL  3 mL Inhalation Q6H PRN Charm Rings, NP      . alum & mag hydroxide-simeth (MAALOX/MYLANTA) 200-200-20 MG/5ML suspension 30 mL  30 mL Oral PRN Charm Rings, NP   30 mL at 05/25/16 1814  . amoxicillin-clavulanate (AUGMENTIN) 875-125 MG per tablet 1 tablet  1 tablet Oral Q12H Jackelyn Poling, NP   1 tablet at 05/26/16 0826  . cloNIDine (CATAPRES) tablet 0.1 mg  0.1 mg Oral BH-qamhs Charm Rings, NP       Followed by  . [START ON 05/29/2016] cloNIDine (CATAPRES) tablet 0.1 mg  0.1 mg Oral QAC breakfast Charm Rings, NP      . dicyclomine (BENTYL) tablet 20 mg  20 mg Oral Q6H PRN Charm Rings, NP   20 mg at 05/25/16 1224  . feeding supplement (ENSURE  ENLIVE) (ENSURE ENLIVE) liquid 237 mL  237 mL Oral TID BM Fernando A Cobos, MD      . gabapentin (NEURONTIN) capsule 300 mg  300 mg Oral TID Charm Rings, NP   300 mg at 05/26/16 1120  . guaiFENesin (MUCINEX) 12 hr tablet 600 mg  600 mg Oral BID PRN Jackelyn Poling, NP   600 mg at 05/24/16 2159  . hydrOXYzine (ATARAX/VISTARIL) tablet 25 mg  25 mg Oral Q6H PRN Charm Rings, NP   25 mg at 05/25/16 1702  . loperamide (IMODIUM) capsule 2-4 mg  2-4 mg Oral PRN Charm Rings, NP      . LORazepam (ATIVAN) tablet 1 mg  1 mg Oral Q6H PRN Oneta Rack, NP   1 mg at 05/25/16 1727  . magnesium hydroxide (MILK OF MAGNESIA) suspension 30 mL  30 mL Oral Daily PRN Charm Rings, NP   30 mL at 05/25/16 0924  . menthol-cetylpyridinium (CEPACOL) lozenge 3 mg  1 lozenge Oral PRN  Jackelyn Poling, NP   3 mg at 05/25/16 1718  . methocarbamol (ROBAXIN) tablet 500 mg  500 mg Oral Q8H PRN Charm Rings, NP   500 mg at 05/26/16 1143  . naproxen (NAPROSYN) tablet 500 mg  500 mg Oral BID PRN Charm Rings, NP   500 mg at 05/26/16 1142  . nicotine (NICODERM CQ - dosed in mg/24 hours) patch 21 mg  21 mg Transdermal Daily Charm Rings, NP   21 mg at 05/26/16 0827  . ondansetron (ZOFRAN) tablet 4 mg  4 mg Oral Q8H PRN Charm Rings, NP   4 mg at 05/25/16 1726  . ondansetron (ZOFRAN-ODT) disintegrating tablet 4 mg  4 mg Oral Q6H PRN Charm Rings, NP      . traZODone (DESYREL) tablet 100 mg  100 mg Oral QHS Charm Rings, NP   100 mg at 05/25/16 2150    Lab Results:  Results for orders placed or performed during the hospital encounter of 05/24/16 (from the past 48 hour(s))  TSH     Status: None   Collection Time: 05/26/16  6:15 AM  Result Value Ref Range   TSH 1.279 0.350 - 4.500 uIU/mL    Comment: Performed by a 3rd Generation assay with a functional sensitivity of <=0.01 uIU/mL. Performed at [facility], [address], [place], [place] [address]     Blood Alcohol level:  Lab Results  Component Value Date   ETH <5 05/24/2016    Metabolic Disorder Labs: No results found for: HGBA1C, MPG No results found for: PROLACTIN No results found for: CHOL, TRIG, HDL, CHOLHDL, VLDL, LDLCALC  Physical Findings: AIMS:  , ,  ,  ,    CIWA:  CIWA-Ar Total: 11 COWS:  COWS Total Score: 1  Musculoskeletal: Strength & Muscle Tone: within normal limits Gait & Station: normal Patient leans: N/A  Psychiatric Specialty Exam: Physical Exam  Nursing note and vitals reviewed. Psychiatric: His mood appears anxious.    Review of Systems  Constitutional: Negative.   HENT: Negative.   Eyes: Negative.   Respiratory: Negative.   Cardiovascular: Negative.   Genitourinary: Negative.   Skin: Negative.     Blood pressure 111/70, pulse 97, temperature 97.3 F  (36.3 C), temperature source Oral, resp. rate 18, height 6' (1.829 m), weight 61.2 kg (135 lb), SpO2 100 %.Body mass index is 18.31 kg/m.  General Appearance: Disheveled  Eye Contact:  Minimal  Speech:  Clear and Coherent  Volume:  Normal  Mood:  Angry, Depressed and Dysphoric  Affect:  Depressed  Thought Process:  Coherent  Orientation:  Full (Time, Place, and Person)  Thought Content:  Rumination  Suicidal Thoughts:  No  Homicidal Thoughts:  No  Memory:  Immediate;   Fair Recent;   Fair Remote;   Fair  Judgement:  Fair  Insight:  Fair  Psychomotor Activity:  Normal  Concentration:  Concentration: Fair and Attention Span: Fair  Recall:  [employer]  Fund of Knowledge:  Fair  Language:  Fair  Akathisia:  No  Handed:  Right  AIMS (if indicated):     Assets:  Desire for Improvement Social Support  ADL's:  Intact  Cognition:  WNL  Sleep:  Number of Hours: 6.25   Treatment Plan Summary: Review of chart, vital signs, medications, and notes.  1-Individual and group therapy  2-Medication management for depression and anxiety: Medications reviewed with the patient.  3-Coping skills for depression, anxiety  4-Continue crisis stabilization and management  5-Address health issues--monitoring vital signs, stable  6-Treatment plan in progress to prevent relapse of depression and anxiety  Lindwood QuaSheila May Agustin, NP [facility] 05/26/2016, 12:25 PM   Agree with NP Progress Note

## 2016-05-27 MED ORDER — ARIPIPRAZOLE 10 MG PO TABS
10.0000 mg | ORAL_TABLET | Freq: Every day | ORAL | Status: DC
  Administered 2016-05-27 – 2016-05-28 (×2): 10 mg via ORAL
  Filled 2016-05-27 (×6): qty 1

## 2016-05-27 NOTE — Plan of Care (Signed)
Problem: Activity: Goal: Sleeping patterns will improve Outcome: Progressing Patient resting in bed without issues or complains. Eyes are closed. Respirations even and unlabored.  Problem: Safety: Goal: Periods of time without injury will increase Outcome: Progressing Patient remains safe on the unit at this time. Patient contracts for safety and is on q 15min safety checks.   

## 2016-05-27 NOTE — Progress Notes (Signed)
D:  Patient denied SI and HI this morning, contracts for safety.  Denied A/V hallucinations. A:  Medications administered per MD orders.  Emotional support and encouragement given patient. R:  Safety maintained with 15 minute checks. Patient has not attended groups today.  Patient slept this afternoon.  Patient upset because he has not been discharged, stated other patients have been discharged that did worst things than he did.  Patient has talked to NP, SW, nurses and MHTs today.  Patient refused to go to dinner in the dining room tonight.   MD stated patient was involuntary and would not be discharged today.  MD will talk to patient again tomorrow.

## 2016-05-27 NOTE — Progress Notes (Signed)
Pt requesting to discharge today. Pt continues to demonstrate labile mood/irritability. Pt has no clear discharge plan and is unable to [facility] due to suicidal threats. CSW returned pt's father's call Roberto Noble([name]) [phone] for additional collateral information. Pt's father concerned that pt will be discharged too soon and without a plan. [name] provided collateral information about pt's prior treatment and gave timeline of what happened with pt prior to his involuntary commitment. Pt's father [facility]. CSW to discuss with pt, who at this point, wants intensive outpatient. CSW continuing to assess.  Roberto Rivas, MSW, LCSW Clinical Social Worker 05/27/2016 10:29 AM

## 2016-05-27 NOTE — Progress Notes (Addendum)
CSW and Lindell Spar NP met with patient individually to discuss aftercare plan. Pt labile and tearful. Pt read journal and stated that his goal is to get sober by any means and to "make my parents proud again." Pt became upset when told he would not be discharging to day and stated that he felt that it was not helping him to be here. He was encouraged to get out of bed and participate in groups and interact on the milieu. Pt attitude and affect changed when told he could not sign himself out. Patient refusing referral for inpatient treatment and not willing to discuss IOP at this time. CSW advised that pt speak with his parents during visitation about aftercare plan and touch base with CSW in the morning. Pt continued to be upset and angry---requesting to speak with doctor. CSW reiterated that pt should not expect to be discharged today due to emotional lability and lack of appropriate discharge plan. NP in agreance.  Maxie Better, MSW, LCSW Clinical Social Worker 05/27/2016 4:03 PM    CSW spoke with pt's father to communicate above. Pt's father is on his way to visit with patient this evening and will talk with him about options.  Maxie Better, MSW, LCSW Clinical Social Worker 05/27/2016 4:04 PM

## 2016-05-27 NOTE — Progress Notes (Signed)
[facility] MD Progress Note  05/27/2016 3:00 PM [name]  MRN:  [id number]  Subjective: Roberto Rivas reports, "I want to be better, do better, be the son my parents bore & love. I do not want to lie to anyone or my self any more. I want to feel like myself again, sleep without worrying, stop all these manipulative behaviors. I want to be sober again & liver sober life for the rest of my life. I want to be real to my girlfriend again, take care of her. 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 His case is discussed with the treatment team. He is in this hospital under IVC. [facility]. And 3 days into his drug abuse treatment, he demanded to have his parents pick him because he wanted to leave the program. And when the parents did not pick him up, he threatened to commit suicide. Today, Roberto Rivas met with the NP & the SW. He presents his written goals into recovery. He read it aloud, he cried as well. However, within a short minute after pouring his soul out in writing, Roberto Rivas turns into someone else. It is almost like night & day. He became inpatient, wanting to be discharged. He states that he left [facility] because he was disrespected. He states that he payed out of his own pocket, 10 thousand dollars to attend that program & yet, he felt his rights were violated. He is asking to be discharged today. He is not yet visible on the unit. He is not attending group sessions. He does no longer want any more residential treatment center placement". He says he his signed 72 hours is up".  Principal Problem: Substance or medication-induced depressive disorder with onset during withdrawal [facility]) Diagnosis:   Patient Active Problem List   Diagnosis Date Noted  . Opioid use disorder, moderate, dependence ([place]) [F11.20] 05/25/2016  . Alcohol use  disorder, severe, dependence ([place]) [F10.20] 05/25/2016  . Substance or medication-induced depressive disorder with onset during withdrawal [facility]) [F19.94] 05/25/2016   Total Time spent with patient: 15 minutes  Past Psychiatric History: See H&P  Past Medical History:  Past Medical History:  Diagnosis Date  . Asthma   . Opiate addiction ([place])    History reviewed. No pertinent surgical history. Family History:  Family History  Problem Relation Age of Onset  . Alcoholism Cousin   . Alcoholism Paternal Grandfather    Family Psychiatric  History: see HPI Social History:  History  Alcohol Use  . Yes    Comment: "fifth and a half a day"     History  Drug Use  . Types: Marijuana, Cocaine, IV    Social History   Social History  . Marital status: Single    Spouse name: N/A  . Number of children: N/A  . Years of education: N/A   Social History Main Topics  . Smoking status: Current Every Day Smoker    Packs/day: 1.00    Years: 2.00    Types: Cigarettes  . Smokeless tobacco: Never Used  . Alcohol use Yes     Comment: "fifth and a half a day"  . Drug use: Yes    Types: Marijuana, Cocaine, IV  . Sexual activity: Not Asked   Other Topics Concern  . None   Social History Narrative  .  None   Additional Social History:     Sleep: Fair  Appetite:  Fair  Current Medications: Current Facility-Administered Medications  Medication Dose Route Frequency Provider Last Rate Last Dose  . acetaminophen (TYLENOL) tablet 650 mg  650 mg Oral Q6H PRN Patrecia Pour, NP   650 mg at 05/25/16 1224  . albuterol (PROVENTIL) (2.5 MG/3ML) 0.083% nebulizer solution 3 mL  3 mL Inhalation Q6H PRN Patrecia Pour, NP      . alum & mag hydroxide-simeth (MAALOX/MYLANTA) 200-200-20 MG/5ML suspension 30 mL  30 mL Oral PRN Patrecia Pour, NP   30 mL at 05/25/16 1814  . amoxicillin-clavulanate (AUGMENTIN) 875-125 MG per tablet 1 tablet  1 tablet Oral Q12H Rozetta Nunnery, NP   1 tablet at 05/27/16  0831  . cloNIDine (CATAPRES) tablet 0.1 mg  0.1 mg Oral BH-qamhs Patrecia Pour, NP   0.1 mg at 05/27/16 0856   Followed by  . [START ON 05/29/2016] cloNIDine (CATAPRES) tablet 0.1 mg  0.1 mg Oral QAC breakfast Patrecia Pour, NP      . dicyclomine (BENTYL) tablet 20 mg  20 mg Oral Q6H PRN Patrecia Pour, NP   20 mg at 05/26/16 2035  . feeding supplement (ENSURE ENLIVE) (ENSURE ENLIVE) liquid 237 mL  237 mL Oral TID BM Myer Peer Cobos, MD   237 mL at 05/27/16 1020  . gabapentin (NEURONTIN) capsule 400 mg  400 mg Oral TID Kerrie Buffalo, NP   400 mg at 05/27/16 1307  . guaiFENesin (MUCINEX) 12 hr tablet 600 mg  600 mg Oral BID PRN Rozetta Nunnery, NP   600 mg at 05/24/16 2159  . hydrOXYzine (ATARAX/VISTARIL) tablet 50 mg  50 mg Oral Q6H PRN Kerrie Buffalo, NP   50 mg at 05/27/16 0834  . loperamide (IMODIUM) capsule 2-4 mg  2-4 mg Oral PRN Patrecia Pour, NP      . LORazepam (ATIVAN) tablet 1 mg  1 mg Oral Q6H PRN Derrill Center, NP   1 mg at 05/26/16 1337  . magnesium hydroxide (MILK OF MAGNESIA) suspension 30 mL  30 mL Oral Daily PRN Patrecia Pour, NP   30 mL at 05/25/16 0924  . menthol-cetylpyridinium (CEPACOL) lozenge 3 mg  1 lozenge Oral PRN Rozetta Nunnery, NP   3 mg at 05/25/16 1718  . methocarbamol (ROBAXIN) tablet 750 mg  750 mg Oral Q8H PRN Kerrie Buffalo, NP   750 mg at 05/27/16 0857  . naproxen (NAPROSYN) tablet 500 mg  500 mg Oral BID PRN Patrecia Pour, NP   500 mg at 05/26/16 2035  . nicotine (NICODERM CQ - dosed in mg/24 hours) patch 21 mg  21 mg Transdermal Daily Patrecia Pour, NP   21 mg at 05/27/16 5456  . ondansetron (ZOFRAN) tablet 4 mg  4 mg Oral Q8H PRN Patrecia Pour, NP   4 mg at 05/25/16 1726  . ondansetron (ZOFRAN-ODT) disintegrating tablet 4 mg  4 mg Oral Q6H PRN Patrecia Pour, NP      . traZODone (DESYREL) tablet 100 mg  100 mg Oral QHS Patrecia Pour, NP   100 mg at 05/26/16 2115   Lab Results:  Results for orders placed or performed during the hospital encounter of  05/24/16 (from the past 48 hour(s))  TSH     Status: None   Collection Time: 05/26/16  6:15 AM  Result Value Ref Range   TSH 1.279 0.350 - 4.500  uIU/mL    Comment: Performed by a 3rd Generation assay with a functional sensitivity of <=0.01 uIU/mL. Performed at [facility], [place] [address], [place], [place] [address]    Blood Alcohol level:  Lab Results  Component Value Date   ETH <5 60/45/4098   Metabolic Disorder Labs: No results found for: HGBA1C, MPG No results found for: PROLACTIN No results found for: CHOL, TRIG, HDL, CHOLHDL, VLDL, LDLCALC  Physical Findings: AIMS: Facial and Oral Movements Muscles of Facial Expression: None, normal Lips and Perioral Area: None, normal Jaw: None, normal Tongue: None, normal,Extremity Movements Upper (arms, wrists, hands, fingers): None, normal Lower (legs, knees, ankles, toes): None, normal, Trunk Movements Neck, shoulders, hips: None, normal, Overall Severity Severity of abnormal movements (highest score from questions above): None, normal Incapacitation due to abnormal movements: None, normal Patient's awareness of abnormal movements (rate only patient's report): No Awareness, Dental Status Current problems with teeth and/or dentures?: No Does patient usually wear dentures?: No  CIWA:  CIWA-Ar Total: 1 COWS:  COWS Total Score: 1  Musculoskeletal: Strength & Muscle Tone: within normal limits Gait & Station: normal Patient leans: N/A  Psychiatric Specialty Exam: Physical Exam  Nursing note and vitals reviewed. Psychiatric: His mood appears anxious.    Review of Systems  Constitutional: Negative.   HENT: Negative.   Eyes: Negative.   Respiratory: Negative.   Cardiovascular: Negative.   Genitourinary: Negative.   Skin: Negative.     Blood pressure (!) 91/50, pulse 65, temperature 97.8 F (36.6 C), temperature source Oral, resp. rate 18, height 6' (1.829 m), weight 61.2 kg (135 lb), SpO2 100 %.Body mass  index is 18.31 kg/m.  General Appearance: Disheveled  Eye Contact:  Minimal  Speech:  Clear and Coherent  Volume:  Normal  Mood:  Angry, Depressed and Dysphoric  Affect:  Depressed  Thought Process:  Coherent  Orientation:  Full (Time, Place, and Person)  Thought Content:  Rumination  Suicidal Thoughts:  No  Homicidal Thoughts:  No  Memory:  Immediate;   Fair Recent;   Fair Remote;   Fair  Judgement:  Fair  Insight:  Fair  Psychomotor Activity:  Normal  Concentration:  Concentration: Fair and Attention Span: Fair  Recall:  [employer] of Knowledge:  Fair  Language:  Fair  Akathisia:  No  Handed:  Right  AIMS (if indicated):     Assets:  Desire for Improvement Social Support  ADL's:  Intact  Cognition:  WNL  Sleep:  Number of Hours: 6.25   Treatment Plan Summary: the stressors. Daily contact with patient to assess and evaluate symptoms and progress in treatment and Medication management  Reviewed past medical records & treatment plan.   Opioid withdrawal symptoms/detox: Will continue the opioid detox protocols.  For Depressed mood: Will continue to monitor for any mood changes.  For Anxiety disorder/agitation: Will continue Gabapentin 400  mg po tid..  For insomnia: Will continue Trazodone 100 po qhs.  Other medical issues & concerns: Will continue Augmentin 875-125 for URI.  - Continue 15 minutes observation for safety concerns - Encouraged to participate in milieu therapy and group therapy counseling sessions and also work with coping skills -  Develop treatment plan to reduce the need for readmission. -  Psycho-social education regarding self care. - Health care follow up as needed for medical problems. - Restart home medications where appropriate.   Encarnacion Slates, NP PMHNP, FNP-BC 05/27/2016, 3:00 PMPatient ID: Roberto Rivas, male   DOB: [birth year],  21 y.o.   MRN: [id number]

## 2016-05-27 NOTE — Plan of Care (Signed)
Problem: Education: Goal: Knowledge of the prescribed therapeutic regimen will improve Outcome: Not Progressing Nurse discussed depression/anxiety/coping skills with patient.

## 2016-05-27 NOTE — [facility] Group Notes (Signed)
Pt attended spiritual care group on grief and loss facilitated by chaplain Roberto Rivas   Group opened with brief discussion and psycho-social ed around grief and loss in relationships and in relation to self - identifying life patterns, circumstances, changes that cause losses. Established group norm of speaking from own life experience. Group goal of establishing open and affirming space for members to share loss and experience with grief, normalize grief experience and provide psycho social education and grief support.   Roberto Rivas was present throughout group.  Alert and oriented x4, he engaged in group discussion voluntarily.  Resonated with another group member who shared that substance use had been a coping strategy for grief and loss.  Spoke specifically of the loss of his friend (also named Roberto Rivas) to suicide.

## 2016-05-27 NOTE — [facility] Group Notes (Signed)
[facility] LCSW Group Therapy  05/27/2016 2:46 PM  Type of Therapy:  Group Therapy  Participation Level:  DID NOT ATTEND. Invited. Pt in room sleeping. Declined offer to attend group.   Summary of Progress/Problems: MHA Speaker came to talk about his personal journey with substance abuse and addiction. The pt processed ways by which to relate to the speaker. MHA speaker provided handouts and educational information pertaining to groups and services offered by the [facility].   Hayes Czaja N Smart LCSW 05/27/2016, 2:46 PM

## 2016-05-27 NOTE — Progress Notes (Addendum)
Nursing Progress Note 7p-7a  D) Patient presents anxious but is pleasant and cooperative. Patient states he has had an anxious visit with his parents stating "they don't think I want help, but [facility] and [facility], so obviously I wanted to be there". Patient states he just wants to make his parents proud. 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 Patient states "I want to be better for myself and my girlfriend". Patient states he has been dating his girlfriend for "over a year" and that she is "going to be a doctor and studies at [employer]". Patient requests to not take his 8 am medications tomorrow. Patient states "they make me tired and I want to be alert when I talk to the doctor tomorrow". Patient states he is frustrated he has not "talked to a doctor more".  Patient denies SI/HI/AVH or pain. Patient contracts for safety at this time.  A) Emotional support given. Patient medicated with PM orders as prescribed. Medications reviewed with patient. Patient on q15 min safety checks. Opportunities for questions or concerns presented to patient. Patient encouraged to continue to work on treatment goals. Patient encouraged to take medications and educated about benefits. Patient encouraged to speak to provider about effects of medications to attempt to change orders. Patient informed he will need to discuss morning medications with nurse on next shift. Writer will also inform next shift of patient's request.   R) Patient receptive to interaction with nurse. Patient remains safe on the unit at this time. Patient is seen interacting with peers in dayroom throughout shift. Will continue to monitor.

## 2016-05-28 MED ORDER — ALBUTEROL SULFATE HFA 108 (90 BASE) MCG/ACT IN AERS: 1.0000 | INHALATION_SPRAY | Freq: Four times a day (QID) | RESPIRATORY_TRACT | Status: DC | PRN

## 2016-05-28 NOTE — Progress Notes (Signed)
CSW spoke with pt at length this morning. Pt is requesting discharge and continues to demonstrate manipulative behavior and staff splitting. Pt tearful but better able to manage emotions when compared to yesterday. Pt is still not interested in inpatient treatment, despite his parents' wishes. He requested that CSW look [place] [facility] [place] [facility] [place] as he plans to move there after discharge. CSW found a [facility] [place] [facility] [facility] and left information with pt (he was sleeping this afternoon). CSW also reviewed this information with pt's father. Pt's father stated that he does not have concerns about pt intentionally harming himself or committing suicide after discharge, but is worried that pt will relapse and unintentionally overdose/harm himself in that way. Pt's father Roberto Noble([name]) is aware that hospital cannot hold pt if they are not endorsing SI/HI or force patients into treatment that they do not want. CSW left message with admissions at [facility] to find out referral process and find out if they accept [employer] out of state insurance. Still waiting for call back at 05/28/2016 3:58 PM   Roberto Rivas, MSW, LCSW Clinical Social Worker 05/28/2016 3:58 PM

## 2016-05-28 NOTE — [facility] Group Notes (Signed)
  [facility] LCSW Group Therapy  05/28/2016 3:10 PM  Type of Therapy:  Group Therapy  Participation Level:  Did Not Attend-pt invited. Chose to rest in room  Summary of Progress/Problems: Emotion Regulation: This group focused on both positive and negative emotion identification and allowed group members to process ways to identify feelings, regulate negative emotions, and find healthy ways to manage internal/external emotions. Group members were asked to reflect on a time when their reaction to an emotion led to a negative outcome and explored how alternative responses using emotion regulation would have benefited them. Group members were also asked to discuss a time when emotion regulation was utilized when a negative emotion was experienced.   Namine Beahm N Smart LCSW 05/28/2016, 3:10 PM

## 2016-05-28 NOTE — Progress Notes (Signed)
D: Pt presents with anxious affect and anxious, depressed mood.  He reports he had a good visit with his girlfriend tonight.  Pt reports he was "throwing up and hand some diarrhea, I had to leave dinner."  Pt reports he would like to discharge; reports his plan was to go to intensive outpatient but "my girlfriend told me about an inpatient place in [place], so I think I'm going to try to go there."  Denies SI/HI, denies hallucinations, reports withdrawal symptom of anxiety and nausea.  Pt has been visible in milieu interacting with peers and staff appropriately tonight.   A: Introduced self to pt.  Met with pt and offered support and encouragement.  Medication provided related to medication regimen.  Medication administered per order.  PRN medication administered for nausea and anxiety.  Q15 minute safety checks maintained.    R: Pt is compliant with medications.  He is receptive to treatment at this time.  Pt is safe on the unit and he verbally contracts for safety.

## 2016-05-28 NOTE — Progress Notes (Signed)
[facility] MD Progress Note  05/28/2016 11:30 AM Roberto Rivas  MRN:  [id number] Subjective:   21 yo Caucasian male, self sufficient and in a relationship. Presented to the ER involuntarily. He was transferred from [facility] [employer] by the police. Patient had checked self voluntarily there for three days. He was stated to have threatened to kill himself if his parents do not facilitate immediate discharge from the addiction facility. At the emergency room, patient denied any intent to harm self. Says he was never suicidal. Says he was sarcastic out of desperation. On the unit, he continues to deny any intent to harm himself or others. He reports feeling anxious because he is being held against his will. He did not have any behavioral issues so far. His family has been visiting daily. Patient was started on a mood stabilizer yesterday. I met with him today. He reports history of rebellion in his teens. His parents are religious figures and did not approve of his choices. He started using alcohol at 13, THC at 14. He quickly progressed to Xanax, LSD, cocaine and later opiates. Says his drug of choice has been Openas. He gets it off an older person who has cancer. Patient snorts but has never used Iv. Patient reports intrafamilial conflict based on his choices. Says he was self motivated to seek help as he has realized his family loves him. He tells me that he was never suicidal. He has never attempted suicide in the past. No history of self mutilation. No past history of being treated for mental illness. Patient reports mood swings which started about the same time he started using street drugs. Says he gets easily irritable. He has tripped on substances. Says the perceptual abnormalities abates once he is off the substances. No hallucinations since he has been here. No persecutory feelings since he has been here. No passivity phenomena since he has been here. Patient is focused on getting discharged. He feels he does  not belong here. He denies access to weapons. He denies any thoughts of violence. He denies homicidal thoughts. He plans to relocate to a different city. Says he has destroyed his phone. These are measures to keep away old contacts that would get him back into using substances. Patient says he has been off substances for about ten days. Says he has not felt this good in a long time. He denies any cravings. He plans to work towards staying sober.  Patient denies any legal issues. No family history of mental illness. No family history of addiction.  Patient tolerated his medication well. I discussed the way the medication works and how it would help with irritability and mood swings. He has agreed to resume taking his medication.   I his parents and was unable to reach both of them at this time.  Principal Problem: Substance or medication-induced depressive disorder with onset during withdrawal [facility]) Diagnosis:   Patient Active Problem List   Diagnosis Date Noted  . Opioid use disorder, moderate, dependence ([place]) [F11.20] 05/25/2016  . Alcohol use disorder, severe, dependence ([place]) [F10.20] 05/25/2016  . Substance or medication-induced depressive disorder with onset during withdrawal [facility]) [F19.94] 05/25/2016   Total Time spent with patient: 30 minutes  Past Psychiatric History: SUD. Never been on psychotropic medication. No past suicidal behavior. No past history of violent behavior.   Past Medical History:  Past Medical History:  Diagnosis Date  . Asthma   . Opiate addiction ([place])    History reviewed. No pertinent surgical history. Family  History:  Family History  Problem Relation Age of Onset  . Alcoholism Cousin   . Alcoholism Paternal Grandfather    Family Psychiatric  History: None Social History:  History  Alcohol Use  . Yes    Comment: "fifth and a half a day"     History  Drug Use  . Types: Marijuana, Cocaine, IV    Social History   Social History  . Marital  status: Single    Spouse name: N/A  . Number of children: N/A  . Years of education: N/A   Social History Main Topics  . Smoking status: Current Every Day Smoker    Packs/day: 1.00    Years: 2.00    Types: Cigarettes  . Smokeless tobacco: Never Used  . Alcohol use Yes     Comment: "fifth and a half a day"  . Drug use: Yes    Types: Marijuana, Cocaine, IV  . Sexual activity: Not Asked   Other Topics Concern  . None   Social History Narrative  . None   Additional Social History:   Patient made some money off investments. He is financially secured. He is in a steady relationship. No kids.  Sleep: Fair  Appetite:  Good  Current Medications: Current Facility-Administered Medications  Medication Dose Route Frequency Provider Last Rate Last Dose  . acetaminophen (TYLENOL) tablet 650 mg  650 mg Oral Q6H PRN Patrecia Pour, NP   650 mg at 05/25/16 1224  . albuterol (PROVENTIL HFA;VENTOLIN HFA) 108 (90 Base) MCG/ACT inhaler 1-2 puff  1-2 puff Inhalation Q6H PRN Artist Beach, MD      . albuterol (PROVENTIL) (2.5 MG/3ML) 0.083% nebulizer solution 3 mL  3 mL Inhalation Q6H PRN Patrecia Pour, NP      . alum & mag hydroxide-simeth (MAALOX/MYLANTA) 200-200-20 MG/5ML suspension 30 mL  30 mL Oral PRN Patrecia Pour, NP   30 mL at 05/25/16 1814  . amoxicillin-clavulanate (AUGMENTIN) 875-125 MG per tablet 1 tablet  1 tablet Oral Q12H Rozetta Nunnery, NP   1 tablet at 05/28/16 0909  . ARIPiprazole (ABILIFY) tablet 10 mg  10 mg Oral Daily Encarnacion Slates, NP   10 mg at 05/28/16 0909  . [START ON 05/29/2016] cloNIDine (CATAPRES) tablet 0.1 mg  0.1 mg Oral QAC breakfast Patrecia Pour, NP      . dicyclomine (BENTYL) tablet 20 mg  20 mg Oral Q6H PRN Patrecia Pour, NP   20 mg at 05/26/16 2035  . feeding supplement (ENSURE ENLIVE) (ENSURE ENLIVE) liquid 237 mL  237 mL Oral TID BM Jenne Campus, MD   237 mL at 05/27/16 2030  . gabapentin (NEURONTIN) capsule 400 mg  400 mg Oral TID Kerrie Buffalo,  NP   400 mg at 05/28/16 0909  . guaiFENesin (MUCINEX) 12 hr tablet 600 mg  600 mg Oral BID PRN Rozetta Nunnery, NP   600 mg at 05/24/16 2159  . hydrOXYzine (ATARAX/VISTARIL) tablet 50 mg  50 mg Oral Q6H PRN Kerrie Buffalo, NP   50 mg at 05/27/16 2032  . loperamide (IMODIUM) capsule 2-4 mg  2-4 mg Oral PRN Patrecia Pour, NP      . LORazepam (ATIVAN) tablet 1 mg  1 mg Oral Q6H PRN Derrill Center, NP   1 mg at 05/26/16 1337  . magnesium hydroxide (MILK OF MAGNESIA) suspension 30 mL  30 mL Oral Daily PRN Patrecia Pour, NP   30 mL at 05/25/16 0924  .  menthol-cetylpyridinium (CEPACOL) lozenge 3 mg  1 lozenge Oral PRN Rozetta Nunnery, NP   3 mg at 05/25/16 1718  . methocarbamol (ROBAXIN) tablet 750 mg  750 mg Oral Q8H PRN Kerrie Buffalo, NP   750 mg at 05/28/16 3532  . naproxen (NAPROSYN) tablet 500 mg  500 mg Oral BID PRN Patrecia Pour, NP   500 mg at 05/27/16 1552  . nicotine (NICODERM CQ - dosed in mg/24 hours) patch 21 mg  21 mg Transdermal Daily Patrecia Pour, NP   21 mg at 05/28/16 0908  . ondansetron (ZOFRAN) tablet 4 mg  4 mg Oral Q8H PRN Patrecia Pour, NP   4 mg at 05/25/16 1726  . ondansetron (ZOFRAN-ODT) disintegrating tablet 4 mg  4 mg Oral Q6H PRN Patrecia Pour, NP      . traZODone (DESYREL) tablet 100 mg  100 mg Oral QHS Patrecia Pour, NP   100 mg at 05/27/16 2146    Lab Results: No results found for this or any previous visit (from the past 85 hour(s)).  Blood Alcohol level:  Lab Results  Component Value Date   ETH <5 99/24/2683    Metabolic Disorder Labs: No results found for: HGBA1C, MPG No results found for: PROLACTIN No results found for: CHOL, TRIG, HDL, CHOLHDL, VLDL, LDLCALC  Physical Findings: AIMS: Facial and Oral Movements Muscles of Facial Expression: None, normal Lips and Perioral Area: None, normal Jaw: None, normal Tongue: None, normal,Extremity Movements Upper (arms, wrists, hands, fingers): None, normal Lower (legs, knees, ankles, toes): None, normal,  Trunk Movements Neck, shoulders, hips: None, normal, Overall Severity Severity of abnormal movements (highest score from questions above): None, normal Incapacitation due to abnormal movements: None, normal Patient's awareness of abnormal movements (rate only patient's report): No Awareness, Dental Status Current problems with teeth and/or dentures?: No Does patient usually wear dentures?: No  CIWA:  CIWA-Ar Total: 1 COWS:  COWS Total Score: 2  Musculoskeletal: Strength & Muscle Tone: within normal limits Gait & Station: normal Patient leans: N/A  Psychiatric Specialty Exam: Physical Exam  Constitutional: He is oriented to person, place, and time. He appears well-developed and well-nourished.  HENT:  Head: Normocephalic and atraumatic.  Eyes: Conjunctivae and EOM are normal. Pupils are equal, round, and reactive to light.  Neck: Normal range of motion. Neck supple.  Cardiovascular: Normal rate, regular rhythm and normal heart sounds.   Respiratory: Effort normal and breath sounds normal.  GI: Soft. Bowel sounds are normal.  Musculoskeletal: Normal range of motion.  Neurological: He is alert and oriented to person, place, and time. He has normal reflexes.  Skin: Skin is warm and dry.  Psychiatric:  As above    ROS  Blood pressure 100/73, pulse (!) 120, temperature 97.7 F (36.5 C), resp. rate 18, height 6' (1.829 m), weight 61.2 kg (135 lb), SpO2 100 %.Body mass index is 18.31 kg/m.  General Appearance: Neatly dressed. anxious initially but calmed down as the interview progressed. Does not appear internally stimulated  Eye Contact:  Good  Speech:  Clear and Coherent and Normal Rate  Volume:  Normal  Mood:  Worried about being here. Feels he does not belong here  Affect:  Appropriate and Congruent  Thought Process:  Coherent and Goal Directed  Orientation:  Full (Time, Place, and Person)  Thought Content:  Focused on what he wants to do on the outside. Focused on being  discharged. No violet theme. No hostile undertones.   Suicidal Thoughts:  No  Homicidal Thoughts:  No  Memory:  WNL  Judgement:  Fair  Insight:  Good  Psychomotor Activity:  Normal  Concentration:  Attention Span: Good  Recall:  Good  Fund of Knowledge:  Good  Language:  Good  Akathisia:  No  Handed:    AIMS (if indicated):     Assets:  Communication Skills Desire for Improvement Financial Resources/Insurance Housing Intimacy Physical Health Social Support  ADL's:  Intact  Cognition:  WNL  Sleep:  Number of Hours: 6.75     Treatment Plan Summary:  Substance related mood disorder vs Bipolar II Disorder. He is currently on a mood stabilizer. Risk to self was main issue at admission. We need collateral from his family.  Plan: 1. Continue Abilify at current dose 2. Collateral from his family. Would clarify if they have risk concerns 3. Likely discharge tomorrow once safety is assured.  4. Continue to monitor mood, beahvior and interaction with others.   Artist Beach, MD 05/28/2016, 11:30 AM

## 2016-05-28 NOTE — Progress Notes (Addendum)
Nursing Note 05/28/2016 [id number]  Data Reports sleeping poor without PRN sleep med.  Rates depression 3/10, hopelessness 5/10, and anxiety 5/10. Affect anxious. Denies HI, SI, AVH.  Reports "I don't need to be here, since I got here it's making me depressed and anxious."  attending groups.  Spends free time in room usually.  Patient reported feeling sick, end of shift reported he had some diarrhea and vomiting.   Action Spoke with patient 1:1, nurse offered support to patient throughout shift.  Continues to be monitored on 15 minute checks for safety. Oncoming RN notified of patient's vomiting/diarrhea  Response Remains safe and appropriate on unit.

## 2016-05-28 NOTE — Plan of Care (Signed)
Problem: Medication: Goal: Compliance with prescribed medication regimen will improve Outcome: Progressing Pt has been compliant with medication regimen tonight.   

## 2016-05-28 NOTE — Progress Notes (Signed)
Pt attend group. Pt said this is day 3. He did not meet with the doctor. He is loosing hope. He really want to tet out of her. He wants to get out of this place. This place is not good for him. He needs to be in a place that will meet his neeeds

## 2016-05-28 NOTE — Progress Notes (Signed)
Recreation Therapy Notes  Date: 05/28/16 Time: 0930 Location: [address] Hall Group Room  Group Topic: Stress Management  Goal Area(s) Addresses:  Patient will verbalize importance of using healthy stress management.  Patient will identify positive emotions associated with healthy stress management.   Intervention: Stress Management  Activity :  Guided Imagery.  LRT introduced the stress management technique of guided imagery.  LRT read a script to allow patients the opportunity to engage in the activity.  Patients were to follow along as LRT read script.  Education:  Stress Management, Discharge Planning.   Education Outcome: Acknowledges edcuation/In group clarification offered/Needs additional education  Clinical Observations/Feedback: Pt did not attend group.   Teliah Buffalo, LRT/CTRS         Lyndsy Gilberto A 05/28/2016 3:01 PM 

## 2016-05-29 MED ORDER — NICOTINE 21 MG/24HR TD PT24: 21.0000 mg | MEDICATED_PATCH | Freq: Every day | TRANSDERMAL | 0 refills | Status: AC

## 2016-05-29 MED ORDER — GABAPENTIN 400 MG PO CAPS: 400.0000 mg | ORAL_CAPSULE | Freq: Three times a day (TID) | ORAL | 0 refills | Status: AC

## 2016-05-29 MED ORDER — HYDROXYZINE HCL 50 MG PO TABS: 50.0000 mg | ORAL_TABLET | Freq: Four times a day (QID) | ORAL | 0 refills | Status: AC | PRN

## 2016-05-29 MED ORDER — TRAZODONE HCL 100 MG PO TABS: 100.0000 mg | ORAL_TABLET | Freq: Every day | ORAL | 0 refills | Status: AC

## 2016-05-29 MED ORDER — ARIPIPRAZOLE 10 MG PO TABS: 10.0000 mg | ORAL_TABLET | Freq: Every day | ORAL | 0 refills | Status: AC

## 2016-05-29 NOTE — [facility] Suicide Risk Assessment (Signed)
[facility] Discharge Suicide Risk Assessment   Principal Problem: Substance or medication-induced depressive disorder with onset during withdrawal [facility]) Discharge Diagnoses:  Patient Active Problem List   Diagnosis Date Noted  . Opioid use disorder, moderate, dependence ([facility]) [F11.20] 05/25/2016  . Alcohol use disorder, severe, dependence ([facility]) [F10.20] 05/25/2016  . Substance or medication-induced depressive disorder with onset during withdrawal [facility]) [F19.94] 05/25/2016    Total Time spent with patient: 30 minutes  Musculoskeletal: Strength & Muscle Tone: within normal limits Gait & Station: normal Patient leans: N/A  Psychiatric Specialty Exam: ROS  Blood pressure 116/78, pulse 84, temperature 98.1 F (36.7 C), temperature source Oral, resp. rate 16, height 6' (1.829 m), weight 61.2 kg (135 lb), SpO2 100 %.Body mass index is 18.31 kg/m.  General Appearance: Neatly dressed, pleasant and engages well. Approrpiate behavior. Not in any distress. Does not appear internally stimulated.  Eye Contact::  Good  Speech:  Clear and Coherent and Normal Rate409  Volume:  Normal  Mood:  Euthymic  Affect:  Appropriate and Full Range  Thought Process:  Coherent and Goal Directed  Orientation:  Full (Time, Place, and Person)  Thought Content:  No thoughts of violence. No delusional theme. Focused on how he would get treatment and make his family proud again No hallucination in any modality  Suicidal Thoughts:  No  Homicidal Thoughts:  No  Memory:  WNL  Judgement:  Good  Insight:  Good  Psychomotor Activity:  Normal  Concentration:  Good  Recall:  Good  Fund of Knowledge:Good  Language: Good  Akathisia:  No  Handed:    AIMS (if indicated):     Assets:  Communication Skills Desire for Improvement Financial Resources/Insurance Housing Intimacy Physical Health Resilience Social Support  Sleep:  Number of Hours: 6.75  Cognition: WNL  ADL's:  Intact   Clinical Assessment:   21 yo  Caucasian male, self sufficient and in a relationship. Admitted on account of threatening to kill himself.  He reports history of rebellion in his teens. His parents are religious figures and did not approve of his choices. He started using alcohol at 13, THC at 14. He quickly progressed to Xanax, LSD, cocaine and later opiates.  Patient tells me today that he is feeling fine mentally. He slept after taking his mood stabilizer yesterday. Says he did not want to take it this morning. I encouraged him to be taking it at night then. No side effects reported. Says he is able to think clearly. He is in good spirits. He has normal energy, normal appetite. Patient has not felt nauseated again today. No evidence of depression. No evidence of anxiety. No evidence of mania. No evidence of psychosis. No craving for substances.   Demographic Factors:  Male  Loss Factors: NA  Historical Factors: NA  Risk Reduction Factors:   Employed, Living with another person, especially a relative, Positive social support and Positive therapeutic relationship  Continued Clinical Symptoms:  None  Cognitive Features That Contribute To Risk:  None    Suicide Risk:  Minimal: No identifiable suicidal ideation.  Modifiable factors targeted during this admission includes substance use disorder and underlying mood disorder. He is currently on a mood stabilizer and has relapse preventive measures in place. At this point his risk is very low. I  Follow-up Information    [facility] Follow up.   Why:  Please call at discharge to arrange admission. You have completed phone screening and have been tentatively accepted. Thank you.  Contact information:  ATTC[facility]dia Bent: Andrew Adams  [address] [place], [place] [id number] Phone: [phone] Fax: [phone]          Plan Of Care/Follow-up recommendations:  1. Continue current psychotropic medications 2. Mental health and addiction follow up   Georgiann CockerVincent A Izediuno,  MD 05/29/2016, 10:19 AM

## 2016-05-29 NOTE — [facility] Group Notes (Signed)
The focus of this group is to educate the patient on the purpose and policies of crisis stabilization and provide a format to answer questions about their admission.  The group details unit policies and expectations of patients while admitted.  Patient did not attend 0900 nurse education orientation group this morning.  Patient stayed in room.  

## 2016-05-29 NOTE — Discharge Summary (Signed)
Physician Discharge Summary Note  Patient:  Roberto Rivas is an 21 y.o., male MRN:  [id number] DOB:  10/18/1995 Patient phone:  [phone] (home)  [place]   [address] [place] [address],  Total Time spent with patient: 30 minutes  Date of Admission:  05/24/2016 Date of Discharge: 05/29/2016  Reason for Admission:  Substance abuse  Principal Problem: Substance or medication-induced depressive disorder with onset during withdrawal [facility](HCC) Discharge Diagnoses: Patient Active Problem List   Diagnosis Date Noted  . Opioid use disorder, moderate, dependence (HCC) [F11.20] 05/25/2016  . Alcohol use disorder, severe, dependence (HCC) [F10.20] 05/25/2016  . Substance or medication-induced depressive disorder with onset during withdrawal [facility](HCC) [F19.94] 05/25/2016    Past Psychiatric History: see HPI  Past Medical History:  Past Medical History:  Diagnosis Date  . Asthma   . Opiate addiction (HCC)    History reviewed. No pertinent surgical history. Family History:  Family History  Problem Relation Age of Onset  . Alcoholism Cousin   . Alcoholism Paternal Grandfather    Family Psychiatric  History: see HPI Social History:  History  Alcohol Use  . Yes    Comment: "fifth and a half a day"     History  Drug Use  . Types: Marijuana, Cocaine, IV    Social History   Social History  . Marital status: Single    Spouse name: N/A  . Number of children: N/A  . Years of education: N/A   Social History Main Topics  . Smoking status: Current Every Day Smoker    Packs/day: 1.00    Years: 2.00    Types: Cigarettes  . Smokeless tobacco: Never Used  . Alcohol use Yes     Comment: "fifth and a half a day"  . Drug use: Yes    Types: Marijuana, Cocaine, IV  . Sexual activity: Not Asked   Other Topics Concern  . None   Social History Narrative  . None    Hospital Course:   Roberto Rivas is an 21 y.o. male who presented to the ED under IVC. According to  the IVC the [facility] refused to pick him up. Pt reportedly had been in detox for the past 3 days and he wanted to be picked up but when his parents refused to pick him up, he told them he was going to kill himself.   Roberto Rivas was admitted for Substance or medication-induced depressive disorder with onset during withdrawal [facility](HCC) and crisis management.  Patient was treated with medications with their indications listed below in detail under Medication List.  Medical problems were identified and treated as needed.  Home medications were restarted as appropriate.  Improvement was monitored by observation and Roberto Rivas daily report of symptom reduction.  Emotional and mental status was monitored by daily self inventory reports completed by Roberto Rivas and clinical staff.  Patient reported continued improvement, denied any new concerns.  Patient had been compliant on medications and denied side effects.  Support and encouragement was provided.    Patient encouraged to attend groups to help with recognizing triggers of emotional crises and de-stabilizations.  Patient encouraged to attend group to help identify the positive things in life that would help in dealing with feelings of loss, depression and unhealthy or abusive tendencies.         Roberto Rivas was evaluated by the treatment team for stability and plans for continued recovery  upon discharge.  Patient was offered further treatment options upon discharge including Residential, Intensive Outpatient and Outpatient treatment. Patient will follow up with agency listed below for medication management and counseling.  Encouraged patient to maintain satisfactory support network and home environment.  Advised to adhere to medication compliance and outpatient treatment follow up.  Prescriptions provided.       Khalee Mazo motivation was an integral factor for scheduling  further treatment.  Employment, transportation, bed availability, health status, family support, and any pending legal issues were also considered during patient's hospital stay.  Upon completion of this admission the patient was both mentally and medically stable for discharge denying suicidal/homicidal ideation, auditory/visual/tactile hallucinations, delusional thoughts and paranoia.      Physical Findings: AIMS: Facial and Oral Movements Muscles of Facial Expression: None, normal Lips and Perioral Area: None, normal Jaw: None, normal Tongue: None, normal,Extremity Movements Upper (arms, wrists, hands, fingers): None, normal Lower (legs, knees, ankles, toes): None, normal, Trunk Movements Neck, shoulders, hips: None, normal, Overall Severity Severity of abnormal movements (highest score from questions above): None, normal Incapacitation due to abnormal movements: None, normal Patient's awareness of abnormal movements (rate only patient's report): No Awareness, Dental Status Current problems with teeth and/or dentures?: No Does patient usually wear dentures?: No  CIWA:  CIWA-Ar Total: 2 COWS:  COWS Total Score: 3  Musculoskeletal: Strength & Muscle Tone: within normal limits Gait & Station: normal Patient leans: N/A  Psychiatric Specialty Exam: Physical Exam  Nursing note and vitals reviewed.   ROS  Blood pressure 116/78, pulse 84, temperature 98.1 F (36.7 C), temperature source Oral, resp. rate 16, height 6' (1.829 m), weight 61.2 kg (135 lb), SpO2 100 %.Body mass index is 18.31 kg/m.   Have you used any form of tobacco in the last 30 days? (Cigarettes, Smokeless Tobacco, Cigars, and/or Pipes): No  Has this patient used any form of tobacco in the last 30 days? (Cigarettes, Smokeless Tobacco, Cigars, and/or Pipes) Yes, N/A  Blood Alcohol level:  Lab Results  Component Value Date   ETH <5 05/24/2016    Metabolic Disorder Labs:  No results found for: HGBA1C, MPG No  results found for: PROLACTIN No results found for: CHOL, TRIG, HDL, CHOLHDL, VLDL, LDLCALC  See Psychiatric Specialty Exam and Suicide Risk Assessment completed by Attending Physician prior to discharge.  Discharge destination:  Home  Is patient on multiple antipsychotic therapies at discharge:  No   Has Patient had three or more failed trials of antipsychotic monotherapy by history:  No  Recommended Plan for Multiple Antipsychotic Therapies: NA   Allergies as of 05/29/2016      Reactions   Banana Anaphylaxis      Medication List    STOP taking these medications   amphetamine-dextroamphetamine 20 MG tablet Commonly known as:  ADDERALL   amphetamine-dextroamphetamine 25 MG 24 hr capsule Commonly known as:  ADDERALL XR   ibuprofen 200 MG tablet Commonly known as:  ADVIL,MOTRIN     TAKE these medications     Indication  ARIPiprazole 10 MG tablet Commonly known as:  ABILIFY Take 1 tablet (10 mg total) by mouth daily. Start taking on:  05/30/2016  Indication:  Mood control   gabapentin 400 MG capsule Commonly known as:  NEURONTIN Take 1 capsule (400 mg total) by mouth 3 (three) times daily.  Indication:  Agitation   hydrOXYzine 50 MG tablet Commonly known as:  ATARAX/VISTARIL Take 1 tablet (50 mg total) by mouth every 6 (six) hours as needed  for anxiety.  Indication:  Anxiety Neurosis   nicotine 21 mg/24hr patch Commonly known as:  NICODERM CQ - dosed in mg/24 hours Place 1 patch (21 mg total) onto the skin daily. Start taking on:  05/30/2016  Indication:  Nicotine Addiction   traZODone 100 MG tablet Commonly known as:  DESYREL Take 1 tablet (100 mg total) by mouth at bedtime.  Indication:  Anxiety Disorder      Follow-up Information    [facility] Follow up.   Why:  Please call at discharge to arrange admission. You have completed phone screening and have been tentatively accepted. Thank you.  Contact information: ATTN: Cyndia Bent  [address] [place], [place] [address] Phone: [phone] Fax: [phone]          Follow-up recommendations:  Activity:  as tol Diet:  as tol  Comments:  1.  Take all your medications as prescribed.   2.  Report any adverse side effects to outpatient provider. 3.  Patient instructed to not use alcohol or illegal drugs while on prescription medicines. 4.  In the event of worsening symptoms, instructed patient to call 911, the crisis hotline or go to nearest emergency room for evaluation of symptoms.  Signed: Lindwood Qua, NP [facility] 05/29/2016, 10:56 AM

## 2016-05-29 NOTE — Tx Team (Signed)
Interdisciplinary Treatment and Diagnostic Plan Update  05/29/2016 Time of Session: 9:30 AM Roberto Rivas MRN: [id number]  Principal Diagnosis: Substance or medication-induced depressive disorder with onset during withdrawal [facility])  Secondary Diagnoses: Principal Problem:   Substance or medication-induced depressive disorder with onset during withdrawal [facility]) Active Problems:   Opioid use disorder, moderate, dependence (HCC)   Alcohol use disorder, severe, dependence ([place])   Current Medications:  Current Facility-Administered Medications  Medication Dose Route Frequency Provider Last Rate Last Dose  . acetaminophen (TYLENOL) tablet 650 mg  650 mg Oral Q6H PRN Patrecia Pour, NP   650 mg at 05/25/16 1224  . albuterol (PROVENTIL HFA;VENTOLIN HFA) 108 (90 Base) MCG/ACT inhaler 1-2 puff  1-2 puff Inhalation Q6H PRN Artist Beach, MD      . albuterol (PROVENTIL) (2.5 MG/3ML) 0.083% nebulizer solution 3 mL  3 mL Inhalation Q6H PRN Patrecia Pour, NP      . alum & mag hydroxide-simeth (MAALOX/MYLANTA) 200-200-20 MG/5ML suspension 30 mL  30 mL Oral PRN Patrecia Pour, NP   30 mL at 05/25/16 1814  . amoxicillin-clavulanate (AUGMENTIN) 875-125 MG per tablet 1 tablet  1 tablet Oral Q12H Rozetta Nunnery, NP   1 tablet at 05/28/16 2006  . ARIPiprazole (ABILIFY) tablet 10 mg  10 mg Oral Daily Encarnacion Slates, NP   10 mg at 05/28/16 0909  . cloNIDine (CATAPRES) tablet 0.1 mg  0.1 mg Oral QAC breakfast Patrecia Pour, NP      . dicyclomine (BENTYL) tablet 20 mg  20 mg Oral Q6H PRN Patrecia Pour, NP   20 mg at 05/26/16 2035  . feeding supplement (ENSURE ENLIVE) (ENSURE ENLIVE) liquid 237 mL  237 mL Oral TID BM Jenne Campus, MD   237 mL at 05/28/16 2119  . gabapentin (NEURONTIN) capsule 400 mg  400 mg Oral TID Kerrie Buffalo, NP   400 mg at 05/28/16 1727  . guaiFENesin (MUCINEX) 12 hr tablet 600 mg  600 mg Oral BID PRN Rozetta Nunnery, NP   600 mg at 05/24/16 2159  . hydrOXYzine (ATARAX/VISTARIL) tablet  50 mg  50 mg Oral Q6H PRN Kerrie Buffalo, NP   50 mg at 05/28/16 2006  . loperamide (IMODIUM) capsule 2-4 mg  2-4 mg Oral PRN Patrecia Pour, NP      . LORazepam (ATIVAN) tablet 1 mg  1 mg Oral Q6H PRN Derrill Center, NP   1 mg at 05/26/16 1337  . magnesium hydroxide (MILK OF MAGNESIA) suspension 30 mL  30 mL Oral Daily PRN Patrecia Pour, NP   30 mL at 05/25/16 0924  . menthol-cetylpyridinium (CEPACOL) lozenge 3 mg  1 lozenge Oral PRN Rozetta Nunnery, NP   3 mg at 05/25/16 1718  . methocarbamol (ROBAXIN) tablet 750 mg  750 mg Oral Q8H PRN Kerrie Buffalo, NP   750 mg at 05/28/16 1730  . naproxen (NAPROSYN) tablet 500 mg  500 mg Oral BID PRN Patrecia Pour, NP   500 mg at 05/28/16 1729  . nicotine (NICODERM CQ - dosed in mg/24 hours) patch 21 mg  21 mg Transdermal Daily Patrecia Pour, NP   21 mg at 05/28/16 0908  . ondansetron (ZOFRAN) tablet 4 mg  4 mg Oral Q8H PRN Patrecia Pour, NP   4 mg at 05/25/16 1726  . ondansetron (ZOFRAN-ODT) disintegrating tablet 4 mg  4 mg Oral Q6H PRN Patrecia Pour, NP   4 mg at 05/28/16 2006  .  traZODone (DESYREL) tablet 100 mg  100 mg Oral QHS Patrecia Pour, NP   100 mg at 05/28/16 2119   PTA Medications: Prescriptions Prior to Admission  Medication Sig Dispense Refill Last Dose  . amphetamine-dextroamphetamine (ADDERALL XR) 25 MG 24 hr capsule Take by mouth.     Marland Kitchen amphetamine-dextroamphetamine (ADDERALL) 20 MG tablet Take by mouth.     Marland Kitchen ibuprofen (ADVIL,MOTRIN) 200 MG tablet Take 400 mg by mouth every 6 (six) hours as needed for fever, headache, mild pain or moderate pain.   05/22/2016 at Unknown time    Patient Stressors:    Patient Strengths:    Treatment Modalities: Medication Management, Group therapy, Case management,  1 to 1 session with clinician, Psychoeducation, Recreational therapy.   Physician Treatment Plan for Primary Diagnosis: Substance or medication-induced depressive disorder with onset during withdrawal ([place]) Long Term Goal(s):  Improvement in symptoms so as ready for discharge Improvement in symptoms so as ready for discharge   Short Term Goals: Ability to identify changes in lifestyle to reduce recurrence of condition will improve Ability to identify and develop effective coping behaviors will improve Ability to identify triggers associated with substance abuse/mental health issues will improve Ability to identify changes in lifestyle to reduce recurrence of condition will improve Ability to verbalize feelings will improve Ability to identify and develop effective coping behaviors will improve Compliance with prescribed medications will improve  Medication Management: Evaluate patient's response, side effects, and tolerance of medication regimen.  Therapeutic Interventions: 1 to 1 sessions, Unit Group sessions and Medication administration.  Evaluation of Outcomes: Met  Physician Treatment Plan for Secondary Diagnosis: Principal Problem:   Substance or medication-induced depressive disorder with onset during withdrawal ([place]) Active Problems:   Opioid use disorder, moderate, dependence (HCC)   Alcohol use disorder, severe, dependence ([place])  Long Term Goal(s): Improvement in symptoms so as ready for discharge Improvement in symptoms so as ready for discharge   Short Term Goals: Ability to identify changes in lifestyle to reduce recurrence of condition will improve Ability to identify and develop effective coping behaviors will improve Ability to identify triggers associated with substance abuse/mental health issues will improve Ability to identify changes in lifestyle to reduce recurrence of condition will improve Ability to verbalize feelings will improve Ability to identify and develop effective coping behaviors will improve Compliance with prescribed medications will improve     Medication Management: Evaluate patient's response, side effects, and tolerance of medication regimen.  Therapeutic  Interventions: 1 to 1 sessions, Unit Group sessions and Medication administration.  Evaluation of Outcomes: Met   RN Treatment Plan for Primary Diagnosis: Substance or medication-induced depressive disorder with onset during withdrawal ([place]) Long Term Goal(s): Knowledge of disease and therapeutic regimen to maintain health will improve  Short Term Goals: Ability to verbalize frustration and anger appropriately will improve, Ability to demonstrate self-control, Ability to participate in decision making will improve, Ability to verbalize feelings will improve, Ability to identify and develop effective coping behaviors will improve and Compliance with prescribed medications will improve  Medication Management: RN will administer medications as ordered by provider, will assess and evaluate patient's response and provide education to patient for prescribed medication. RN will report any adverse and/or side effects to prescribing provider.  Therapeutic Interventions: 1 on 1 counseling sessions, Psychoeducation, Medication administration, Evaluate responses to treatment, Monitor vital signs and CBGs as ordered, Perform/monitor CIWA, COWS, AIMS and Fall Risk screenings as ordered, Perform wound care treatments as ordered.  Evaluation of Outcomes: Met  LCSW Treatment Plan for Primary Diagnosis: Substance or medication-induced depressive disorder with onset during withdrawal [facility]) Long Term Goal(s): Safe transition to appropriate next level of care at discharge, Engage patient in therapeutic group addressing interpersonal concerns.  Short Term Goals: Engage patient in aftercare planning with referrals and resources, Increase ability to appropriately verbalize feelings, Facilitate acceptance of mental health diagnosis and concerns, Facilitate patient progression through stages of change regarding substance use diagnoses and concerns, Identify triggers associated with mental health/substance abuse issues  and Increase skills for wellness and recovery  Therapeutic Interventions: Assess for all discharge needs, 1 to 1 time with Social worker, Explore available resources and support systems, Assess for adequacy in community support network, Educate family and significant other(s) on suicide prevention, Complete Psychosocial Assessment, Interpersonal group therapy.  Evaluation of Outcomes: Met   Progress in Treatment: Attending groups: Yes Participating in groups: Yes, when he attends  Taking medication as prescribed: Yes. Toleration medication: Yes. Family/Significant other contact made: SPE completed with pt's father/collateral information provided as well.  Patient understands diagnosis: No. Discussing patient identified problems/goals with staff: Yes. Medical problems stabilized or resolved: Yes. Denies suicidal/homicidal ideation: Yes, per self report.  Issues/concerns per patient self-inventory: No. Other: na  New problem(s) identified: Yes, Describe:  family does not want contact w family, cannot articulate realistic plan for aftercare  New Short Term/Long Term Goal(s):  Engage patient in motivational interviewing re substance abuse issues and discharge planning, increase options  Discharge Plan or Barriers: Pt accepted to [facility] in [place]. [phone]  Reason for Continuation of Hospitalization: none  Estimated Length of Stay: discharge today   Attendees: Patient: 05/29/2016 10:05 AM  Physician: Dr. Sanjuana Letters MD 05/29/2016 10:05 AM  Nursing: Roanna Banning RN 05/29/2016 10:05 AM  RN Care Manager: Addison Naegeli RN CM 05/29/2016 10:05 AM  Social Worker: Nira Conn Smart, [place]; Adriana Reams LCSW 05/29/2016 10:05 AM  Recreational Therapist:  05/29/2016 10:05 AM  Other: Lindell Spar NP; May Augustin NP 05/29/2016 10:05 AM  Other:  05/29/2016 10:05 AM  Other: 05/29/2016 10:05 AM    Scribe for Treatment Team: [place], LCSW 05/29/2016 10:05 AM

## 2016-05-29 NOTE — Progress Notes (Signed)
D:  Patient's self inventory sheet, patient has poor sleep, sleep medication not helpful.  Fair appetite, low energy level, poor concentration.  Rated depression 3, hopeless 5, anxiety 7.  Denied withdrawals.  Denied SI.  Denied physical problems, then checked lightheaded, pain, headaches.  Physical pain, knees, back pain 7 in past 24 hours.  Pain medication not helpful.  Goal is to get out of here and go to a healthy place.  Plans to talk to SW.  Does have discharge plans. A:  Medications were refused by patient this morning.  Patient wanted his ensure to be given to another patient that was discharging.  Nurse explained to patient that cannot be done because a MD order is needed.  R:  Patient denied SI and HI, contracts for safety.   Denied A/V hallucinations.  Safety maintained with 15 minute checks. Patient has been irritable, angry, pacing.  Patient stated he wanted prns, then refused prns.

## 2016-05-29 NOTE — Progress Notes (Signed)
Referral faxed to [facility] per patient request: [phone] ATTN: Greig CastillaAndrew. They are working to secure pt flight for this evening or tomorrow.  Trula SladeHeather Smart, MSW, LCSW Clinical Social Worker 05/29/2016 11:37 AM

## 2016-05-29 NOTE — Progress Notes (Signed)
  [facility] Discharge Plan :  Will you be returning to the same living situation after discharge:  Yes,  home to pack and arrange [facility]  At discharge, do you have transportation home?: Yes,  friend coming at 11am Do you have the ability to pay for your medications: Yes,  [employer] insurance  Release of information consent forms completed and submitted to medical records by CSW.  Patient to Follow up at: Follow-up Information    [facility] Follow up.   Why:  Please call at discharge to arrange admission. You have completed phone screening and have been tentatively accepted. Thank you.  Contact information: ATTN: Cyndia Bentndrew Adams  [address] [place], [place] [id number] Phone: [phone] Fax: [phone]          Next level of care provider has access to [facility] Link:no  Safety Planning and Suicide Prevention discussed: Yes,  SPE completed with pt's father. SPI pamphlet and Mobile crisis information provided.  Have you used any form of tobacco in the last 30 days? (Cigarettes, Smokeless Tobacco, Cigars, and/or Pipes): No  Has patient been referred to the Quitline?: N/A patient is not a smoker  Patient has been referred for addiction treatment: Yes  [place] Kimberlin N Smart LCSW 05/29/2016, 10:04 AM

## 2016-05-29 NOTE — Progress Notes (Signed)
Discharge Note:  Patient discharged with friend.  Patient denied SI and HI.  Denied A/V hallucinations.  Suicide prevention information given and discussed with patient who stated he understood and had no questions.  Patient stated he received all his belongings.  Patient stated he appreciated all [facility].  All required discharge information given to patient at discharge.
